# Patient Record
Sex: Male | Born: 1970
Health system: Southern US, Community
[De-identification: ages and names within clinical notes are randomized; demographics above are authoritative.]

## PROBLEM LIST (undated history)

## (undated) DIAGNOSIS — M109 Gout, unspecified: Secondary | ICD-10-CM

## (undated) DIAGNOSIS — Z9889 Other specified postprocedural states: Secondary | ICD-10-CM

## (undated) DIAGNOSIS — I10 Essential (primary) hypertension: Secondary | ICD-10-CM

## (undated) DIAGNOSIS — E119 Type 2 diabetes mellitus without complications: Secondary | ICD-10-CM

## (undated) DIAGNOSIS — E669 Obesity, unspecified: Secondary | ICD-10-CM

## (undated) DIAGNOSIS — G43909 Migraine, unspecified, not intractable, without status migrainosus: Secondary | ICD-10-CM

## (undated) DIAGNOSIS — E785 Hyperlipidemia, unspecified: Secondary | ICD-10-CM

## (undated) HISTORY — DX: Other specified postprocedural states: Z98.890

## (undated) HISTORY — DX: Obesity, unspecified: E66.9

## (undated) HISTORY — DX: Type 2 diabetes mellitus without complications: E11.9

## (undated) HISTORY — DX: Hyperlipidemia, unspecified: E78.5

## (undated) HISTORY — PX: HAND SURGERY: SHX662

## (undated) HISTORY — DX: Gout, unspecified: M10.9

## (undated) HISTORY — DX: Essential (primary) hypertension: I10

## (undated) HISTORY — DX: Migraine, unspecified, not intractable, without status migrainosus: G43.909

---

## 2009-01-04 ENCOUNTER — Ambulatory Visit (HOSPITAL_BASED_OUTPATIENT_CLINIC_OR_DEPARTMENT_OTHER): Admission: RE | Admit: 2009-01-04 | Discharge: 2009-01-04 | Payer: Self-pay | Admitting: Orthopedic Surgery

## 2009-01-04 ENCOUNTER — Encounter (INDEPENDENT_AMBULATORY_CARE_PROVIDER_SITE_OTHER): Payer: Self-pay | Admitting: Orthopedic Surgery

## 2009-08-16 ENCOUNTER — Ambulatory Visit (HOSPITAL_BASED_OUTPATIENT_CLINIC_OR_DEPARTMENT_OTHER): Admission: RE | Admit: 2009-08-16 | Discharge: 2009-08-16 | Payer: Self-pay | Admitting: Orthopedic Surgery

## 2009-10-06 ENCOUNTER — Ambulatory Visit (HOSPITAL_BASED_OUTPATIENT_CLINIC_OR_DEPARTMENT_OTHER): Admission: RE | Admit: 2009-10-06 | Discharge: 2009-10-06 | Payer: Self-pay | Admitting: Orthopedic Surgery

## 2009-12-05 ENCOUNTER — Ambulatory Visit (HOSPITAL_BASED_OUTPATIENT_CLINIC_OR_DEPARTMENT_OTHER): Admission: RE | Admit: 2009-12-05 | Discharge: 2009-12-05 | Payer: Self-pay | Admitting: Orthopedic Surgery

## 2011-02-12 NOTE — Op Note (Signed)
NAMEJAQUIL, Derek Hanna              ACCOUNT NO.:  [id number]   MEDICAL RECORD NO.:  [id number]          PATIENT TYPE:  AMB   LOCATION:  DSC                          FACILITY:  [facility]   PHYSICIAN:  Cindee Salt, M.D.       DATE OF BIRTH:  May 21, 1971   DATE OF PROCEDURE:  01/04/2009  DATE OF DISCHARGE:                               OPERATIVE REPORT   PREOPERATIVE DIAGNOSIS:  Mass, right index finger.   POSTOPERATIVE DIAGNOSIS:  Mass, right index finger.   OPERATION:  Excision, mass, right index finger.   SURGEON:  Cindee Salt, MD   ASSISTANT:  Joaquin Courts, RN   ANESTHESIA:  Forearm-based IV regional.   ANESTHESIOLOGIST:  Zenon Mayo, MD.   HISTORY:  The patient is a 40 year old male with a history of a mass on  the volar radial aspect of metacarpophalangeal joint of his right index  finger.  He is desirous of having this excised.  He is aware of risks  and complications including infection; recurrence of injury to arteries,  nerves, tendons, incomplete relief of symptoms; and dystrophy.  In the  preoperative area, the patient is seen.  The extremity marked by both  the patient and surgeon.  Antibiotic was given to the patient.   PROCEDURE:  The patient was brought to the operating room where a  forearm-based IV regional anesthetic was carried out without difficulty  under the direction of Dr. Sampson Goon.  He was prepped using ChloraPrep,  a 3 minute dry time was taken,  and he was then draped.  A time-out was  taken confirming the patient and procedure.  Following this, after  adequate anesthesia was afforded, a mid lateral incision was then made  and carried down through the subcutaneous tissue.  This was then brought  to the palmar side of the hand and carried down through subcutaneous  tissue proximally.  The digital artery and nerve were identified and  protected.  A multilobulated tan, brown solid tumor was immediately  encountered and with blunt and sharp dissection  this was dissected free,  appeared to arise from the flexor tendon sheath.  The entire specimen  was sent to Pathology.  The neurovascular structures were protected.  The area was locally infiltrated with 0.25% Marcaine without  epinephrine.  Bleeders were electrocauterized with bipolar.  The wound  was copiously irrigated with saline.  The skin was then closed with  interrupted 5-0 Vicryl Rapide sutures.  A sterile compressive dressing  was applied to the index middle finger.  The  patient tolerated the procedure well.  On deflation of the tourniquet,  all fingers immediately pinked.  He was taken to the recovery room for  observation in satisfactory condition.  He will be discharged home to  return to the [facility] [place] in 1 week, on Vicodin.            ______________________________  Cindee Salt, M.D.     GK/MEDQ  D:  01/04/2009  T:  01/05/2009  Job:  [id number]

## 2011-02-12 NOTE — Op Note (Signed)
NAMEDANUEL, FELICETTI              ACCOUNT NO.:  [id number]   MEDICAL RECORD NO.:  [id number]          PATIENT TYPE:  AMB   LOCATION:  DSC                          FACILITY:  [facility]   PHYSICIAN:  Cindee Salt, M.D.       DATE OF BIRTH:  1970/11/26   DATE OF PROCEDURE:  10/06/2008  DATE OF DISCHARGE:                               OPERATIVE REPORT   PREOPERATIVE DIAGNOSES:  1. Stenosing tenosynovitis, left index finger.  2. Carpal tunnel syndrome, left hand.   POSTOPERATIVE DIAGNOSES:  1. Stenosing tenosynovitis, left index finger.  2. Carpal tunnel syndrome, left hand.   OPERATION:  Release A1 pulley, left index finger with removal of flexor  sheath cyst and release of left carpal tunnel, left hand.   SURGEON:  Cindee Salt, MD   ANESTHESIA:  General.   ANESTHESIOLOGIST:  Janetta Hora. Gelene Mink, MD   HISTORY:  The patient is a 40 year old male with a history of bilateral  carpal tunnel syndrome.  EMG nerve conduction is positive.  Triggering  of his left index finger with moderate pain.  This has not responded to  conservative treatment.  He has elected to undergo surgical release of  the A1 pulley and release of the left carpal canal.  He is aware of  risks and complications including infection, recurrence of injury to  arteries, nerves, tendons, incomplete relief of symptoms, or dystrophy.  In the preoperative area, the patient is seen.  The extremity marked by  both the patient and surgeon.  Antibiotic given.   PROCEDURE:  The patient was brought to the operating room where a  general anesthetic was carried out without difficulty.  Under the  direction of Dr. Gelene Mink, he was prepped using ChloraPrep, supine  position, right arm free.  A 3-minute dry time was allowed.  Time-out  taken confirming the patient and procedure, he was then draped.  The  limb was exsanguinated with an Esmarch bandage.  Tourniquet placed high  on the forearm, was inflated to 250 mmHg.  An oblique  incision was made  over the A1 pulley of the left thumb, carried down through subcutaneous  tissue.  Bleeders were electrocauterized.  Neurovascular bundles were  identified and protected.  The radial side of the A1 pulley was then  released.  The A2 pulley was identified.  A small incision was made  centrally.  A central cyst was present over the distal aspect of the A1  pulley, this was removed.  The finger placed through a full range of  motion, a partial tenosynovectomy performed proximally.  No further  lesions or triggering was noted.  The wound was irrigated.  The skin  closed with interrupted 5-0 Vicryl Rapide sutures.  Separate incision  was then made over the volar aspect of the wrist in line with the ring  finger, carried down through the subcutaneous tissue.  The palmar fascia  was split, superficial palmar arch identified, the flexor tendon to the  ring and little finger identified to the ulnar side of median nerve and  carpal retinaculum was incised with sharp dissection.  A right-angle  and  Sewall retractor were placed between skin and forearm fascia.  The  fascia was released for approximately a centimeter and a half proximal  to the wrist crease under direct vision.  Canal was explored.  No  further lesions were identified.  Area of compression to the nerve was  apparent.  Persistent median artery was present.  Bleeders were  electrocauterized with bipolar.  The wound was copiously irrigated with  saline.  The skin then closed with interrupted 5-0 Vicryl Rapide  sutures.  A local infiltration was then given with 0.25% Marcaine  without epinephrine, approximately 6 mL was used.  A sterile compressive  dressing and splint with the fingers free was applied.  On deflation of  the tourniquet, all fingers immediately pinked.  He was taken to the  recovery room for observation in satisfactory condition.  He will be  discharged home.  To return to [facility] [place] in 1  week, on  Vicodin.           ______________________________  Cindee Salt, M.D.     GK/MEDQ  D:  10/06/2009  T:  10/07/2009  Job:  [id number]

## 2013-10-19 ENCOUNTER — Encounter: Payer: 59 | Attending: Family Medicine

## 2013-10-19 VITALS — Ht 67.0 in | Wt 221.0 lb

## 2013-10-19 DIAGNOSIS — E119 Type 2 diabetes mellitus without complications: Secondary | ICD-10-CM | POA: Insufficient documentation

## 2013-10-19 DIAGNOSIS — Z713 Dietary counseling and surveillance: Secondary | ICD-10-CM | POA: Insufficient documentation

## 2013-10-20 NOTE — Progress Notes (Signed)
Patient was seen on 10/19/13 for the first of a series of three diabetes self-management courses at the [facility].  Current HbA1c: 7.0%  The following learning objectives were met by the patient during this class:  Describe diabetes  State some common risk factors for diabetes  Defines the role of glucose and insulin  Identifies type of diabetes and pathophysiology  Describe the relationship between diabetes and cardiovascular risk  State the members of the Healthcare Team  States the rationale for glucose monitoring  State when to test glucose  State their individual Target Range  State the importance of logging glucose readings  Describe how to interpret glucose readings  Identifies A1C target  Explain the correlation between A1c and eAG values  State symptoms and treatment of high blood glucose  State symptoms and treatment of low blood glucose  Explain proper technique for glucose testing  Identifies proper sharps disposal  Handouts given during class include:  Living Well with Diabetes book  Carb Counting and Meal Planning book  Meal Plan Card  Carbohydrate guide  Meal planning worksheet  Low Sodium Flavoring Tips  The diabetes portion plate  R4V to eAG Conversion Chart  Diabetes Medications  Stress Management  Diabetes Recommended Care Schedule  Diabetes Success Plan  Core Class Satisfaction Survey  Follow-Up Plan:  Attend core 2

## 2013-10-26 DIAGNOSIS — E119 Type 2 diabetes mellitus without complications: Secondary | ICD-10-CM

## 2013-10-27 NOTE — Progress Notes (Signed)

## 2013-11-02 ENCOUNTER — Encounter: Payer: 59 | Attending: Family Medicine

## 2013-11-02 DIAGNOSIS — E119 Type 2 diabetes mellitus without complications: Secondary | ICD-10-CM | POA: Insufficient documentation

## 2013-11-02 DIAGNOSIS — Z713 Dietary counseling and surveillance: Secondary | ICD-10-CM | POA: Insufficient documentation

## 2013-11-02 NOTE — Progress Notes (Signed)
Patient was seen on 11/02/13 for the third of a series of three diabetes self-management courses at the [facility]. The following learning objectives were met by the patient during this class:    State the amount of activity recommended for healthy living   Describe activities suitable for individual needs   Identify ways to regularly incorporate activity into daily life   Identify barriers to activity and ways to over come these barriers  Identify diabetes medications being personally used and their primary action for lowering glucose and possible side effects   Describe role of stress on blood glucose and develop strategies to address psychosocial issues   Identify diabetes complications and ways to prevent them  Explain how to manage diabetes during illness   Evaluate success in meeting personal goal   Establish 2-3 goals that they will plan to diligently work on until they return for the  4-monthfollow-up visit  Goals:  Follow Diabetes Meal Plan as instructed  Aim for 15-30 mins of physical activity daily as tolerated  Bring food record and glucose log to your follow up visit  Your patient has established the following 4 month goals in their individualized success plan: I will count my carb choices at most meals and snacks and reduce fats by eating less sweets I will increase my activity level at least 3 days a week by being active 30 minutes or more 5 times a week  Your patient has identified these potential barriers to change:  Time management, work a little less  Your patient has identified their diabetes self-care support plan as  Attend support group  Educate spouse

## 2014-02-28 ENCOUNTER — Encounter: Payer: 59 | Attending: Family Medicine

## 2014-02-28 DIAGNOSIS — Z713 Dietary counseling and surveillance: Secondary | ICD-10-CM | POA: Insufficient documentation

## 2014-02-28 DIAGNOSIS — E119 Type 2 diabetes mellitus without complications: Secondary | ICD-10-CM | POA: Insufficient documentation

## 2014-03-01 NOTE — Progress Notes (Signed)
Patient was seen on 02/28/2014 for a review of the series of three diabetes self-management courses at the [facility]. The following learning objectives were met by the patient during this class:    Reviewed blood glucose monitoring and interpretation including the recommended target ranges and Hgb A1c.    Reviewed on carb counting, importance of regularly scheduled meals/snacks, and meal planning.    Reviewed the effects of physical activity on glucose levels and long-term glucose control.  Recommended goal of 150 minutes of physical activity/week.   Reviewed patient medications and discussed role of medication on blood glucose and possible side effects.   Discussed strategies to manage stress, psychosocial issues, and other obstacles to diabetes management.   Encouraged moderate weight reduction to improve glucose levels.     Reviewed short-term complications: hyper- and hypo-glycemia.  Discussed causes, symptoms, and treatment options.   Reviewed prevention, detection, and treatment of long-term complications.  Discussed the role of prolonged elevated glucose levels on body systems.  Goals:  Follow Diabetes Meal Plan as instructed  Eat 3 meals and 2 snacks, every 3-5 hrs  Limit carbohydrate intake to 45 grams carbohydrate/meal Limit carbohydrate intake to 15 grams carbohydrate/snack Add lean protein foods to meals/snacks  Monitor glucose levels as instructed by your doctor  Aim for goal of 15-30 mins of physical activity daily as tolerated  Bring food record and glucose log to your next nutrition visit   

## 2014-09-02 ENCOUNTER — Other Ambulatory Visit: Payer: Self-pay | Admitting: Gastroenterology

## 2016-02-16 ENCOUNTER — Other Ambulatory Visit: Payer: Self-pay | Admitting: Family Medicine

## 2016-02-16 ENCOUNTER — Ambulatory Visit
Admission: RE | Admit: 2016-02-16 | Discharge: 2016-02-16 | Disposition: A | Discharge status: Home / Self Care | Payer: 59 | Source: Ambulatory Visit | Attending: Family Medicine | Admitting: Family Medicine

## 2016-02-16 DIAGNOSIS — M79644 Pain in right finger(s): Secondary | ICD-10-CM

## 2016-05-23 ENCOUNTER — Other Ambulatory Visit: Payer: Self-pay | Admitting: Orthopedic Surgery

## 2016-11-07 ENCOUNTER — Ambulatory Visit (HOSPITAL_BASED_OUTPATIENT_CLINIC_OR_DEPARTMENT_OTHER): Admit: 2016-11-07 | Payer: 59 | Admitting: Orthopedic Surgery

## 2016-11-07 ENCOUNTER — Encounter (HOSPITAL_BASED_OUTPATIENT_CLINIC_OR_DEPARTMENT_OTHER): Payer: Self-pay

## 2016-11-07 SURGERY — RELEASE, A1 PULLEY, FOR TRIGGER FINGER
Anesthesia: Monitor Anesthesia Care | Laterality: Right

## 2016-11-08 DIAGNOSIS — M65342 Trigger finger, left ring finger: Secondary | ICD-10-CM | POA: Diagnosis not present

## 2016-11-08 DIAGNOSIS — M65332 Trigger finger, left middle finger: Secondary | ICD-10-CM | POA: Diagnosis not present

## 2016-11-08 DIAGNOSIS — M65311 Trigger thumb, right thumb: Secondary | ICD-10-CM | POA: Diagnosis not present

## 2016-12-02 DIAGNOSIS — E7800 Pure hypercholesterolemia, unspecified: Secondary | ICD-10-CM | POA: Diagnosis not present

## 2016-12-02 DIAGNOSIS — I10 Essential (primary) hypertension: Secondary | ICD-10-CM | POA: Diagnosis not present

## 2016-12-02 DIAGNOSIS — E119 Type 2 diabetes mellitus without complications: Secondary | ICD-10-CM | POA: Diagnosis not present

## 2017-03-05 DIAGNOSIS — E7800 Pure hypercholesterolemia, unspecified: Secondary | ICD-10-CM | POA: Diagnosis not present

## 2017-03-05 DIAGNOSIS — E119 Type 2 diabetes mellitus without complications: Secondary | ICD-10-CM | POA: Diagnosis not present

## 2017-05-05 DIAGNOSIS — M65342 Trigger finger, left ring finger: Secondary | ICD-10-CM | POA: Diagnosis not present

## 2017-05-05 DIAGNOSIS — M65332 Trigger finger, left middle finger: Secondary | ICD-10-CM | POA: Diagnosis not present

## 2017-05-27 DIAGNOSIS — E7800 Pure hypercholesterolemia, unspecified: Secondary | ICD-10-CM | POA: Diagnosis not present

## 2017-05-27 DIAGNOSIS — I10 Essential (primary) hypertension: Secondary | ICD-10-CM | POA: Diagnosis not present

## 2017-05-27 DIAGNOSIS — Z0000 Encounter for general adult medical examination without abnormal findings: Secondary | ICD-10-CM | POA: Diagnosis not present

## 2017-05-27 DIAGNOSIS — E1165 Type 2 diabetes mellitus with hyperglycemia: Secondary | ICD-10-CM | POA: Diagnosis not present

## 2017-07-21 ENCOUNTER — Other Ambulatory Visit: Payer: Self-pay | Admitting: Orthopedic Surgery

## 2017-09-17 ENCOUNTER — Encounter (HOSPITAL_BASED_OUTPATIENT_CLINIC_OR_DEPARTMENT_OTHER): Payer: Self-pay | Admitting: *Deleted

## 2017-09-24 ENCOUNTER — Encounter (HOSPITAL_BASED_OUTPATIENT_CLINIC_OR_DEPARTMENT_OTHER)
Admission: RE | Admit: 2017-09-24 | Discharge: 2017-09-24 | Disposition: A | Discharge status: Home / Self Care | Payer: 59 | Source: Ambulatory Visit | Attending: Orthopedic Surgery | Admitting: Orthopedic Surgery

## 2017-09-24 ENCOUNTER — Other Ambulatory Visit: Payer: Self-pay

## 2017-09-24 DIAGNOSIS — M65342 Trigger finger, left ring finger: Secondary | ICD-10-CM | POA: Diagnosis not present

## 2017-09-24 DIAGNOSIS — E119 Type 2 diabetes mellitus without complications: Secondary | ICD-10-CM | POA: Diagnosis not present

## 2017-09-24 DIAGNOSIS — Z7982 Long term (current) use of aspirin: Secondary | ICD-10-CM | POA: Diagnosis not present

## 2017-09-24 DIAGNOSIS — I10 Essential (primary) hypertension: Secondary | ICD-10-CM | POA: Diagnosis not present

## 2017-09-24 DIAGNOSIS — Z79899 Other long term (current) drug therapy: Secondary | ICD-10-CM | POA: Diagnosis not present

## 2017-09-24 DIAGNOSIS — E785 Hyperlipidemia, unspecified: Secondary | ICD-10-CM | POA: Diagnosis not present

## 2017-09-24 DIAGNOSIS — M65332 Trigger finger, left middle finger: Secondary | ICD-10-CM | POA: Diagnosis not present

## 2017-09-24 LAB — BASIC METABOLIC PANEL
ANION GAP: 10 (ref 5–15)
BUN: 10 mg/dL (ref 6–20)
CO2: 26 mmol/L (ref 22–32)
Calcium: 8.6 mg/dL — ABNORMAL LOW (ref 8.9–10.3)
Chloride: 103 mmol/L (ref 101–111)
Creatinine, Ser: 1.04 mg/dL (ref 0.61–1.24)
Glucose, Bld: 106 mg/dL — ABNORMAL HIGH (ref 65–99)
POTASSIUM: 4 mmol/L (ref 3.5–5.1)
SODIUM: 139 mmol/L (ref 135–145)

## 2017-09-25 ENCOUNTER — Encounter (HOSPITAL_BASED_OUTPATIENT_CLINIC_OR_DEPARTMENT_OTHER): Payer: Self-pay | Admitting: *Deleted

## 2017-09-25 ENCOUNTER — Ambulatory Visit (HOSPITAL_BASED_OUTPATIENT_CLINIC_OR_DEPARTMENT_OTHER): Payer: 59 | Admitting: Anesthesiology

## 2017-09-25 ENCOUNTER — Ambulatory Visit (HOSPITAL_BASED_OUTPATIENT_CLINIC_OR_DEPARTMENT_OTHER)
Admission: RE | Admit: 2017-09-25 | Discharge: 2017-09-25 | Disposition: A | Discharge status: Home / Self Care | Payer: 59 | Source: Ambulatory Visit | Attending: Orthopedic Surgery | Admitting: Orthopedic Surgery

## 2017-09-25 ENCOUNTER — Other Ambulatory Visit: Payer: Self-pay

## 2017-09-25 ENCOUNTER — Encounter (HOSPITAL_BASED_OUTPATIENT_CLINIC_OR_DEPARTMENT_OTHER)
Admission: RE | Disposition: A | Discharge status: Home / Self Care | Payer: Self-pay | Source: Ambulatory Visit | Attending: Orthopedic Surgery

## 2017-09-25 DIAGNOSIS — Z79899 Other long term (current) drug therapy: Secondary | ICD-10-CM | POA: Insufficient documentation

## 2017-09-25 DIAGNOSIS — M65332 Trigger finger, left middle finger: Secondary | ICD-10-CM | POA: Diagnosis not present

## 2017-09-25 DIAGNOSIS — E119 Type 2 diabetes mellitus without complications: Secondary | ICD-10-CM | POA: Diagnosis not present

## 2017-09-25 DIAGNOSIS — I10 Essential (primary) hypertension: Secondary | ICD-10-CM | POA: Insufficient documentation

## 2017-09-25 DIAGNOSIS — M65342 Trigger finger, left ring finger: Secondary | ICD-10-CM | POA: Insufficient documentation

## 2017-09-25 DIAGNOSIS — E785 Hyperlipidemia, unspecified: Secondary | ICD-10-CM | POA: Insufficient documentation

## 2017-09-25 DIAGNOSIS — Z7982 Long term (current) use of aspirin: Secondary | ICD-10-CM | POA: Insufficient documentation

## 2017-09-25 DIAGNOSIS — M65842 Other synovitis and tenosynovitis, left hand: Secondary | ICD-10-CM | POA: Diagnosis not present

## 2017-09-25 HISTORY — PX: TRIGGER FINGER RELEASE: SHX641

## 2017-09-25 SURGERY — RELEASE, A1 PULLEY, FOR TRIGGER FINGER
Anesthesia: Monitor Anesthesia Care | Site: Finger | Laterality: Left

## 2017-09-25 MED ORDER — OXYCODONE HCL 5 MG/5ML PO SOLN
5.0000 mg | Freq: Once | ORAL | Status: DC | PRN
Start: 2017-09-25 — End: 2017-09-25

## 2017-09-25 MED ORDER — LIDOCAINE HCL (PF) 0.5 % IJ SOLN
INTRAMUSCULAR | Status: DC | PRN
  Administered 2017-09-25: 35 mL via INTRAVENOUS

## 2017-09-25 MED ORDER — FENTANYL CITRATE (PF) 100 MCG/2ML IJ SOLN
INTRAMUSCULAR | Status: AC
  Filled 2017-09-25: qty 2

## 2017-09-25 MED ORDER — PROPOFOL 500 MG/50ML IV EMUL
INTRAVENOUS | Status: AC
  Filled 2017-09-25: qty 50

## 2017-09-25 MED ORDER — DEXAMETHASONE SODIUM PHOSPHATE 10 MG/ML IJ SOLN
INTRAMUSCULAR | Status: DC | PRN
  Administered 2017-09-25: 4 mg via INTRAVENOUS

## 2017-09-25 MED ORDER — LACTATED RINGERS IV SOLN
INTRAVENOUS | Status: DC
  Administered 2017-09-25: 08:00:00 via INTRAVENOUS

## 2017-09-25 MED ORDER — BUPIVACAINE HCL (PF) 0.25 % IJ SOLN
INTRAMUSCULAR | Status: DC | PRN
  Administered 2017-09-25: 9 mL

## 2017-09-25 MED ORDER — MIDAZOLAM HCL 2 MG/2ML IJ SOLN
INTRAMUSCULAR | Status: AC
  Filled 2017-09-25: qty 2

## 2017-09-25 MED ORDER — CEFAZOLIN SODIUM-DEXTROSE 2-4 GM/100ML-% IV SOLN
INTRAVENOUS | Status: AC
  Filled 2017-09-25: qty 100

## 2017-09-25 MED ORDER — FENTANYL CITRATE (PF) 100 MCG/2ML IJ SOLN
50.0000 ug | INTRAMUSCULAR | Status: DC | PRN
  Administered 2017-09-25 (×2): 50 ug via INTRAVENOUS

## 2017-09-25 MED ORDER — TRAMADOL HCL 50 MG PO TABS: 50.0000 mg | ORAL_TABLET | Freq: Four times a day (QID) | ORAL | 0 refills | Status: DC | PRN

## 2017-09-25 MED ORDER — CEFAZOLIN SODIUM-DEXTROSE 2-4 GM/100ML-% IV SOLN
2.0000 g | INTRAVENOUS | Status: AC
  Administered 2017-09-25: 2 g via INTRAVENOUS

## 2017-09-25 MED ORDER — SCOPOLAMINE 1 MG/3DAYS TD PT72: 1.0000 | MEDICATED_PATCH | Freq: Once | TRANSDERMAL | Status: DC | PRN

## 2017-09-25 MED ORDER — CHLORHEXIDINE GLUCONATE 4 % EX LIQD: 60.0000 mL | Freq: Once | CUTANEOUS | Status: DC

## 2017-09-25 MED ORDER — PROPOFOL 10 MG/ML IV BOLUS
INTRAVENOUS | Status: DC | PRN
  Administered 2017-09-25 (×2): 20 mg via INTRAVENOUS

## 2017-09-25 MED ORDER — ONDANSETRON HCL 4 MG/2ML IJ SOLN
INTRAMUSCULAR | Status: AC
  Filled 2017-09-25: qty 2

## 2017-09-25 MED ORDER — LIDOCAINE HCL (CARDIAC) 20 MG/ML IV SOLN
INTRAVENOUS | Status: DC | PRN
  Administered 2017-09-25: 20 mg via INTRAVENOUS

## 2017-09-25 MED ORDER — ONDANSETRON HCL 4 MG/2ML IJ SOLN: 4.0000 mg | Freq: Four times a day (QID) | INTRAMUSCULAR | Status: DC | PRN

## 2017-09-25 MED ORDER — BUPIVACAINE HCL (PF) 0.25 % IJ SOLN
INTRAMUSCULAR | Status: AC
  Filled 2017-09-25: qty 120

## 2017-09-25 MED ORDER — FENTANYL CITRATE (PF) 100 MCG/2ML IJ SOLN: 25.0000 ug | INTRAMUSCULAR | Status: DC | PRN

## 2017-09-25 MED ORDER — MIDAZOLAM HCL 2 MG/2ML IJ SOLN
1.0000 mg | INTRAMUSCULAR | Status: DC | PRN
Start: 2017-09-25 — End: 2017-09-25
  Administered 2017-09-25: 2 mg via INTRAVENOUS

## 2017-09-25 MED ORDER — ONDANSETRON HCL 4 MG/2ML IJ SOLN
INTRAMUSCULAR | Status: DC | PRN
  Administered 2017-09-25: 4 mg via INTRAVENOUS

## 2017-09-25 MED ORDER — DEXAMETHASONE SODIUM PHOSPHATE 10 MG/ML IJ SOLN
INTRAMUSCULAR | Status: AC
  Filled 2017-09-25: qty 1

## 2017-09-25 MED ORDER — OXYCODONE HCL 5 MG PO TABS: 5.0000 mg | ORAL_TABLET | Freq: Once | ORAL | Status: DC | PRN

## 2017-09-25 MED ORDER — LIDOCAINE 2% (20 MG/ML) 5 ML SYRINGE
INTRAMUSCULAR | Status: AC
  Filled 2017-09-25: qty 5

## 2017-09-25 SURGICAL SUPPLY — 32 items
BANDAGE COBAN STERILE 2 (GAUZE/BANDAGES/DRESSINGS) ×3 IMPLANT
BLADE SURG 15 STRL LF DISP TIS (BLADE) ×1 IMPLANT
BLADE SURG 15 STRL SS (BLADE) ×2
BNDG ESMARK 4X9 LF (GAUZE/BANDAGES/DRESSINGS) ×3 IMPLANT
CHLORAPREP W/TINT 26ML (MISCELLANEOUS) ×3 IMPLANT
CORD BIPOLAR FORCEPS 12FT (ELECTRODE) IMPLANT
COVER BACK TABLE 60X90IN (DRAPES) ×3 IMPLANT
COVER MAYO STAND STRL (DRAPES) ×3 IMPLANT
CUFF TOURNIQUET SINGLE 18IN (TOURNIQUET CUFF) ×3 IMPLANT
DECANTER SPIKE VIAL GLASS SM (MISCELLANEOUS) IMPLANT
DRAPE EXTREMITY T 121X128X90 (DRAPE) ×3 IMPLANT
DRAPE SURG 17X23 STRL (DRAPES) ×3 IMPLANT
GAUZE SPONGE 4X4 12PLY STRL (GAUZE/BANDAGES/DRESSINGS) ×3 IMPLANT
GAUZE XEROFORM 1X8 LF (GAUZE/BANDAGES/DRESSINGS) ×3 IMPLANT
GLOVE BIOGEL PI IND STRL 7.0 (GLOVE) ×2 IMPLANT
GLOVE BIOGEL PI IND STRL 8.5 (GLOVE) ×1 IMPLANT
GLOVE BIOGEL PI INDICATOR 7.0 (GLOVE) ×4
GLOVE BIOGEL PI INDICATOR 8.5 (GLOVE) ×2
GLOVE ECLIPSE 6.5 STRL STRAW (GLOVE) ×3 IMPLANT
GLOVE SURG ORTHO 8.0 STRL STRW (GLOVE) ×3 IMPLANT
GOWN STRL REUS W/ TWL LRG LVL3 (GOWN DISPOSABLE) ×1 IMPLANT
GOWN STRL REUS W/TWL LRG LVL3 (GOWN DISPOSABLE) ×2
GOWN STRL REUS W/TWL XL LVL3 (GOWN DISPOSABLE) ×3 IMPLANT
NEEDLE PRECISIONGLIDE 27X1.5 (NEEDLE) ×3 IMPLANT
NS IRRIG 1000ML POUR BTL (IV SOLUTION) ×3 IMPLANT
PACK BASIN DAY SURGERY FS (CUSTOM PROCEDURE TRAY) ×3 IMPLANT
STOCKINETTE 4X48 STRL (DRAPES) ×3 IMPLANT
SUT ETHILON 4 0 PS 2 18 (SUTURE) ×3 IMPLANT
SYR BULB 3OZ (MISCELLANEOUS) ×3 IMPLANT
SYR CONTROL 10ML LL (SYRINGE) ×3 IMPLANT
TOWEL OR 17X24 6PK STRL BLUE (TOWEL DISPOSABLE) ×6 IMPLANT
UNDERPAD 30X30 (UNDERPADS AND DIAPERS) ×3 IMPLANT

## 2017-09-25 NOTE — Transfer of Care (Signed)
Immediate Anesthesia Transfer of Care Note  Patient: Derek Hanna  Procedure(s) Performed: RELEASE TRIGGER FINGER/A-1 PULLEY LEFT MIDDLE FINGER AND LEFT RING FINGER (Left Finger)  Patient Location: PACU  Anesthesia Type:MAC and Bier block  Level of Consciousness: awake, alert  and oriented  Airway & Oxygen Therapy: Patient Spontanous Breathing  Post-op Assessment: Report given to RN and Post -op Vital signs reviewed and stable  Post vital signs: Reviewed and stable  Last Vitals:  Vitals:   09/25/17 0751  BP: 137/63  Pulse: 65  Resp: 20  Temp: 36.8 C  SpO2: 99%    Last Pain:  Vitals:   09/25/17 0751  TempSrc: Oral         Complications: No apparent anesthesia complications

## 2017-09-25 NOTE — Anesthesia Preprocedure Evaluation (Signed)
Anesthesia Evaluation  Patient identified by MRN, date of birth, ID band Patient awake    Reviewed: Allergy & Precautions, H&P , NPO status , Patient's Chart, lab work & pertinent test results  Airway Mallampati: II   Neck ROM: full    Dental   Pulmonary neg pulmonary ROS,    breath sounds clear to auscultation       Cardiovascular hypertension,  Rhythm:regular Rate:Normal     Neuro/Psych  Headaches,    GI/Hepatic   Endo/Other  diabetes, Type 2  Renal/GU      Musculoskeletal   Abdominal   Peds  Hematology   Anesthesia Other Findings   Reproductive/Obstetrics                             Anesthesia Physical Anesthesia Plan  ASA: II  Anesthesia Plan: Bier Block and MAC and Bier Block-LIDOCAINE ONLY   Post-op Pain Management:    Induction: Intravenous  PONV Risk Score and Plan: 1 and Ondansetron, Propofol infusion, Midazolam and Treatment may vary due to age or medical condition  Airway Management Planned: Simple Face Mask  Additional Equipment:   Intra-op Plan:   Post-operative Plan:   Informed Consent: I have reviewed the patients History and Physical, chart, labs and discussed the procedure including the risks, benefits and alternatives for the proposed anesthesia with the patient or authorized representative who has indicated his/her understanding and acceptance.     Plan Discussed with: CRNA, Anesthesiologist and Surgeon  Anesthesia Plan Comments:         Anesthesia Quick Evaluation

## 2017-09-25 NOTE — Op Note (Signed)
Dictation Number [id number]

## 2017-09-25 NOTE — Op Note (Signed)
NAMDorena Bodo:  Roemmich, Jusitn              ACCOUNT NO.:  [id number]  MEDICAL RECORD NO.:  [id number]  LOCATION:                                 FACILITY:  PHYSICIAN:  Cindee SaltGary Estreya Clay, M.D.            DATE OF BIRTH:  DATE OF PROCEDURE:  09/15/2017 DATE OF DISCHARGE:                              OPERATIVE REPORT   PREOPERATIVE DIAGNOSIS:  Stenosing tenosynovitis, left middle and left ring fingers.  POSTOPERATIVE DIAGNOSIS:  Stenosing tenosynovitis, left middle and left ring fingers.  OPERATION:  Release A1 pulley, left middle and left ring fingers.  SURGEON:  Cindee SaltGary Kieli Golladay, M.D.  ASSISTANT:  None.  ANESTHESIA:  Forearm based IV regional with local infiltration.  [facility]  ANESTHESIOLOGIST:  Achille RichAdam Hodierne, MD.  HISTORY:  The patient is a 46 year old male with a history of triggering of his middle and ring fingers, left hand.  This is not responded to conservative treatment.  He has elected to undergo surgical release of the A1 pulley of each of those digits.  Pre, peri, and postoperative course have been discussed along with risks and complications.  He is aware that there is no guarantee to the surgery; the possibility of infection; recurrence of injury to arteries, nerves, tendons; incomplete relief of symptoms; and dystrophy.  In preoperative area, the patient was seen, the extremity marked by both patient and surgeon.  Antibiotic given.  DESCRIPTION OF PROCEDURE:  The patient was brought to the operating room, where a forearm-based IV regional anesthetic was carried out without difficulty.  He was prepped using ChloraPrep in supine position with left arm free.  A 3-minute dry time was allowed and time-out taken, confirming patient and procedure.  An oblique incision was made over the A1 pulley of the left middle finger carried down through subcutaneous tissue.  The dissection was carried down protecting neurovascular bundles radially and ulnarly.   Retractors placed.  The A1 pulley was then released on its radial aspect.  A small incision was made centrally in A2.  Deep tendon separated with partial tenosynovectomy proximally to allow full flexion of the finger passively without any further triggering noted.  An oblique incision was then made over the ring finger A1 pulley, carried down through subcutaneous tissue.  Retractors again placed protecting neurovascular bundles radially and ulnarly.  The A1 pulley was identified and released on its radial aspect.  A small incision was made centrally in A2.  Tendons separated with a partial tenosynovectomy proximally.  The finger placed through a full passive range of motion, no further triggering was identified.  The wound was copiously irrigated with saline.  This patient could still flex his fingers, though fully flexed and no triggering was noted actively.  The wounds were then closed with interrupted 4-0 nylon sutures local infiltration with 0.25% bupivacaine without epinephrine was given, approximately 8 mL was used.  A sterile compressive dressing with the fingers free was applied.  On deflation of the tourniquet, all fingers immediately pinked.  He was taken to the recovery room for observation in satisfactory condition.  He will be discharged home [facility] [place] [facility], on Ultram.          ______________________________ Cindee SaltGary Brayan Votaw, M.D.     GK/MEDQ  D:  09/25/2017  T:  09/25/2017  Job:  [id number]

## 2017-09-25 NOTE — Anesthesia Procedure Notes (Signed)
Procedure Name: MAC Performed by: Delvon Chipps W, CRNA Pre-anesthesia Checklist: Patient identified, Timeout performed, Emergency Drugs available, Patient being monitored and Suction available Oxygen Delivery Method: Simple face mask       

## 2017-09-25 NOTE — Anesthesia Procedure Notes (Signed)
Anesthesia Regional Block: Bier block (IV Regional)   Pre-Anesthetic Checklist: ,, timeout performed, Correct Patient, Correct Site, Correct Laterality, Correct Procedure,, site marked, surgical consent,, at surgeon's request Needles:  Injection technique: Single-shot  Needle Type: Other      Needle Gauge: 20     Additional Needles:   Procedures:,,,,, intact distal pulses, Esmarch exsanguination, single tourniquet utilized,  Narrative:   Performed by: Personally       
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
## 2017-09-25 NOTE — Brief Op Note (Signed)
09/25/2017  9:14 AM  PATIENT:  Dorena BodoMarcus Simi  46 y.o. male  PRE-OPERATIVE DIAGNOSIS:  stenosing Tenosynovitis left middle and ring finger  POST-OPERATIVE DIAGNOSIS:  stenosing Tenosynovitis left middle and ring finger  PROCEDURE:  Procedure(s): RELEASE TRIGGER FINGER/A-1 PULLEY LEFT MIDDLE FINGER AND LEFT RING FINGER (Left)  SURGEON:  Surgeon(s) and Role:    Cindee Salt* Compton Brigance, MD - Primary  PHYSICIAN ASSISTANT:   ASSISTANTS: none   ANESTHESIA:   local, regional and IV sedation  EBL:  2 mL   BLOOD ADMINISTERED:none  DRAINS: none   LOCAL MEDICATIONS USED:  BUPIVICAINE   SPECIMEN:  No Specimen  DISPOSITION OF SPECIMEN:  N/A  COUNTS:  YES  TOURNIQUET:   Total Tourniquet Time Documented: Forearm (Left) - 19 minutes Total: Forearm (Left) - 19 minutes   DICTATION: .Other Dictation: Dictation Number [id number]  PLAN OF CARE: Discharge to home after PACU  PATIENT DISPOSITION:  PACU - hemodynamically stable.

## 2017-09-25 NOTE — Discharge Instructions (Addendum)

## 2017-09-25 NOTE — Anesthesia Postprocedure Evaluation (Signed)
Anesthesia Post Note  Patient: Derek Hanna  Procedure(s) Performed: RELEASE TRIGGER FINGER/A-1 PULLEY LEFT MIDDLE FINGER AND LEFT RING FINGER (Left Finger)     Patient location during evaluation: PACU Anesthesia Type: MAC and Bier Block Level of consciousness: awake and alert Pain management: pain level controlled Vital Signs Assessment: post-procedure vital signs reviewed and stable Respiratory status: spontaneous breathing, nonlabored ventilation, respiratory function stable and patient connected to nasal cannula oxygen Cardiovascular status: stable and blood pressure returned to baseline Postop Assessment: no apparent nausea or vomiting Anesthetic complications: no    Last Vitals:  Vitals:   09/25/17 0930 09/25/17 0943  BP: 127/77 136/78  Pulse: 64 68  Resp: 11 17  Temp:  36.6 C  SpO2: 98% 99%    Last Pain:  Vitals:   09/25/17 0943  TempSrc: Oral  PainSc: 0-No pain                 Yemaya Barnier S

## 2017-09-26 ENCOUNTER — Encounter (HOSPITAL_BASED_OUTPATIENT_CLINIC_OR_DEPARTMENT_OTHER): Payer: Self-pay | Admitting: Orthopedic Surgery

## 2017-11-28 DIAGNOSIS — E119 Type 2 diabetes mellitus without complications: Secondary | ICD-10-CM | POA: Diagnosis not present

## 2017-11-28 DIAGNOSIS — E7800 Pure hypercholesterolemia, unspecified: Secondary | ICD-10-CM | POA: Diagnosis not present

## 2017-11-28 DIAGNOSIS — I10 Essential (primary) hypertension: Secondary | ICD-10-CM | POA: Diagnosis not present

## 2018-03-06 IMAGING — CR DG FINGER THUMB 2+V*R*
1 series · 1 of 1 positions shown · non-contrast
Comparison: None.

CLINICAL DATA: Injury 2 weeks ago

EXAM:
RIGHT THUMB 2+V

[view not recorded]
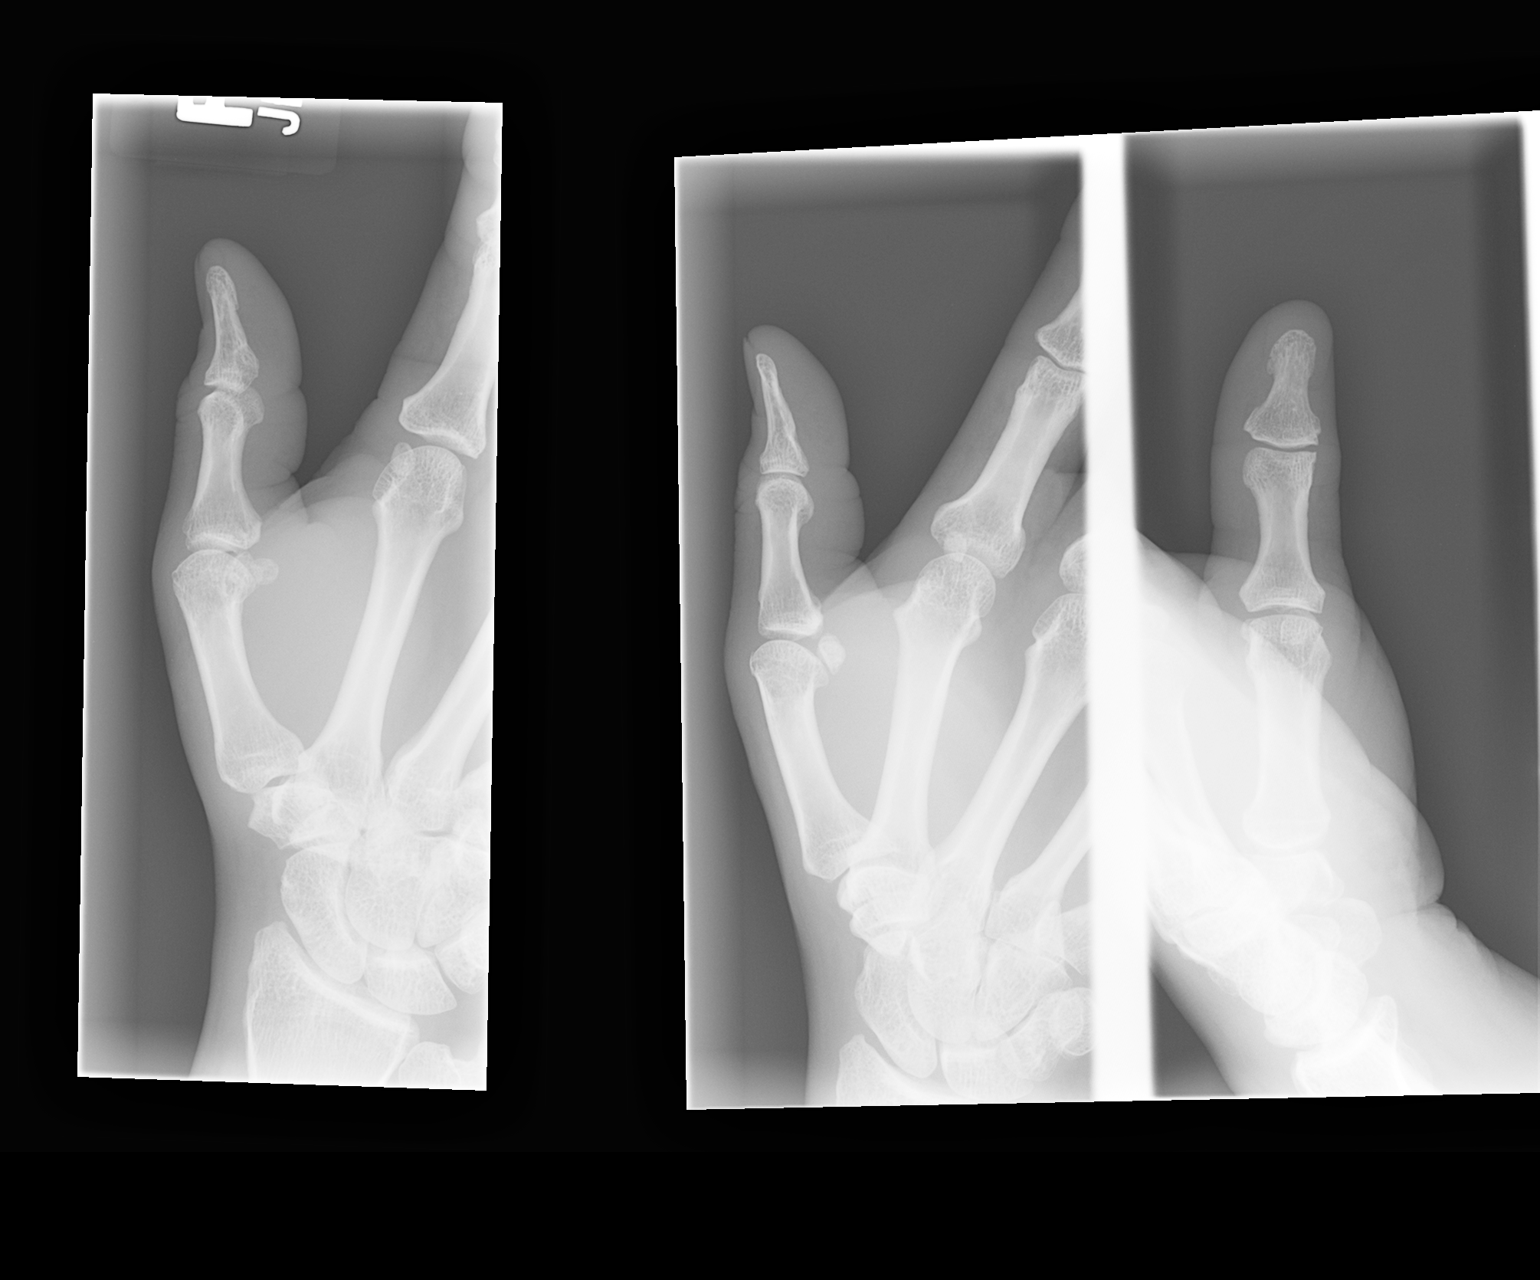

[1 of 1 positions shown; findings below may reference images not displayed]

FINDINGS: No acute fracture. No dislocation.  Unremarkable soft tissues.
IMPRESSION: No acute bony pathology

## 2018-05-28 DIAGNOSIS — E119 Type 2 diabetes mellitus without complications: Secondary | ICD-10-CM | POA: Diagnosis not present

## 2018-05-28 DIAGNOSIS — E7800 Pure hypercholesterolemia, unspecified: Secondary | ICD-10-CM | POA: Diagnosis not present

## 2018-05-28 DIAGNOSIS — Z0000 Encounter for general adult medical examination without abnormal findings: Secondary | ICD-10-CM | POA: Diagnosis not present

## 2018-05-28 DIAGNOSIS — I10 Essential (primary) hypertension: Secondary | ICD-10-CM | POA: Diagnosis not present

## 2018-05-28 DIAGNOSIS — K625 Hemorrhage of anus and rectum: Secondary | ICD-10-CM | POA: Diagnosis not present

## 2018-06-10 DIAGNOSIS — K642 Third degree hemorrhoids: Secondary | ICD-10-CM | POA: Diagnosis not present

## 2018-08-26 DIAGNOSIS — M65331 Trigger finger, right middle finger: Secondary | ICD-10-CM | POA: Diagnosis not present

## 2018-09-01 ENCOUNTER — Other Ambulatory Visit: Payer: Self-pay | Admitting: Orthopedic Surgery

## 2018-09-15 ENCOUNTER — Other Ambulatory Visit: Payer: Self-pay

## 2018-09-15 ENCOUNTER — Encounter (HOSPITAL_BASED_OUTPATIENT_CLINIC_OR_DEPARTMENT_OTHER): Payer: Self-pay | Admitting: *Deleted

## 2018-09-18 ENCOUNTER — Encounter (HOSPITAL_BASED_OUTPATIENT_CLINIC_OR_DEPARTMENT_OTHER)
Admission: RE | Admit: 2018-09-18 | Discharge: 2018-09-18 | Disposition: A | Discharge status: Home / Self Care | Payer: 59 | Source: Ambulatory Visit | Attending: Orthopedic Surgery | Admitting: Orthopedic Surgery

## 2018-09-18 ENCOUNTER — Other Ambulatory Visit: Payer: Self-pay

## 2018-09-18 DIAGNOSIS — Z01818 Encounter for other preprocedural examination: Secondary | ICD-10-CM | POA: Insufficient documentation

## 2018-09-18 DIAGNOSIS — I10 Essential (primary) hypertension: Secondary | ICD-10-CM | POA: Insufficient documentation

## 2018-09-18 LAB — BASIC METABOLIC PANEL
Anion gap: 11 (ref 5–15)
BUN: 12 mg/dL (ref 6–20)
CO2: 25 mmol/L (ref 22–32)
Calcium: 9.1 mg/dL (ref 8.9–10.3)
Chloride: 104 mmol/L (ref 98–111)
Creatinine, Ser: 1 mg/dL (ref 0.61–1.24)
GFR calc Af Amer: >60 mL/min (ref >60)
GFR calc non Af Amer: >60 mL/min (ref >60)
GLUCOSE: 87 mg/dL (ref 70–99)
Potassium: 4 mmol/L (ref 3.5–5.1)
Sodium: 140 mmol/L (ref 135–145)

## 2018-09-25 ENCOUNTER — Ambulatory Visit (HOSPITAL_BASED_OUTPATIENT_CLINIC_OR_DEPARTMENT_OTHER)
Admission: RE | Admit: 2018-09-25 | Discharge: 2018-09-25 | Disposition: A | Discharge status: Home / Self Care | Payer: 59 | Source: Ambulatory Visit | Attending: Orthopedic Surgery | Admitting: Orthopedic Surgery

## 2018-09-25 ENCOUNTER — Other Ambulatory Visit: Payer: Self-pay

## 2018-09-25 ENCOUNTER — Encounter (HOSPITAL_BASED_OUTPATIENT_CLINIC_OR_DEPARTMENT_OTHER): Payer: Self-pay | Admitting: Certified Registered"

## 2018-09-25 ENCOUNTER — Ambulatory Visit (HOSPITAL_BASED_OUTPATIENT_CLINIC_OR_DEPARTMENT_OTHER): Payer: 59 | Admitting: Certified Registered"

## 2018-09-25 ENCOUNTER — Encounter (HOSPITAL_BASED_OUTPATIENT_CLINIC_OR_DEPARTMENT_OTHER)
Admission: RE | Disposition: A | Discharge status: Home / Self Care | Payer: Self-pay | Source: Ambulatory Visit | Attending: Orthopedic Surgery

## 2018-09-25 DIAGNOSIS — E119 Type 2 diabetes mellitus without complications: Secondary | ICD-10-CM | POA: Diagnosis not present

## 2018-09-25 DIAGNOSIS — M65331 Trigger finger, right middle finger: Secondary | ICD-10-CM | POA: Insufficient documentation

## 2018-09-25 DIAGNOSIS — M65841 Other synovitis and tenosynovitis, right hand: Secondary | ICD-10-CM | POA: Diagnosis present

## 2018-09-25 DIAGNOSIS — I10 Essential (primary) hypertension: Secondary | ICD-10-CM | POA: Insufficient documentation

## 2018-09-25 DIAGNOSIS — M659 Synovitis and tenosynovitis, unspecified: Secondary | ICD-10-CM | POA: Diagnosis not present

## 2018-09-25 HISTORY — PX: TRIGGER FINGER RELEASE: SHX641

## 2018-09-25 SURGERY — RELEASE, A1 PULLEY, FOR TRIGGER FINGER
Anesthesia: Regional | Site: Finger | Laterality: Right

## 2018-09-25 MED ORDER — KETOROLAC TROMETHAMINE 30 MG/ML IJ SOLN
INTRAMUSCULAR | Status: DC | PRN
  Administered 2018-09-25: 30 mg via INTRAVENOUS

## 2018-09-25 MED ORDER — PROPOFOL 10 MG/ML IV BOLUS
INTRAVENOUS | Status: DC | PRN
  Administered 2018-09-25: 20 mg via INTRAVENOUS

## 2018-09-25 MED ORDER — FENTANYL CITRATE (PF) 100 MCG/2ML IJ SOLN
INTRAMUSCULAR | Status: AC
  Filled 2018-09-25: qty 2

## 2018-09-25 MED ORDER — MIDAZOLAM HCL 2 MG/2ML IJ SOLN
1.0000 mg | INTRAMUSCULAR | Status: DC | PRN
  Administered 2018-09-25: 2 mg via INTRAVENOUS

## 2018-09-25 MED ORDER — 0.9 % SODIUM CHLORIDE (POUR BTL) OPTIME
TOPICAL | Status: DC | PRN
  Administered 2018-09-25: 200 mL

## 2018-09-25 MED ORDER — KETOROLAC TROMETHAMINE 30 MG/ML IJ SOLN
INTRAMUSCULAR | Status: AC
  Filled 2018-09-25: qty 1

## 2018-09-25 MED ORDER — LIDOCAINE 2% (20 MG/ML) 5 ML SYRINGE
INTRAMUSCULAR | Status: AC
  Filled 2018-09-25: qty 5

## 2018-09-25 MED ORDER — LIDOCAINE HCL (CARDIAC) PF 100 MG/5ML IV SOSY
PREFILLED_SYRINGE | INTRAVENOUS | Status: DC | PRN
  Administered 2018-09-25: 20 mg via INTRAVENOUS

## 2018-09-25 MED ORDER — ONDANSETRON HCL 4 MG/2ML IJ SOLN
INTRAMUSCULAR | Status: AC
  Filled 2018-09-25: qty 4

## 2018-09-25 MED ORDER — MIDAZOLAM HCL 2 MG/2ML IJ SOLN
INTRAMUSCULAR | Status: AC
  Filled 2018-09-25: qty 2

## 2018-09-25 MED ORDER — LACTATED RINGERS IV SOLN
INTRAVENOUS | Status: DC
  Administered 2018-09-25: 13:00:00 via INTRAVENOUS

## 2018-09-25 MED ORDER — TRAMADOL HCL 50 MG PO TABS: 50.0000 mg | ORAL_TABLET | Freq: Four times a day (QID) | ORAL | 0 refills | Status: DC | PRN

## 2018-09-25 MED ORDER — FENTANYL CITRATE (PF) 100 MCG/2ML IJ SOLN
50.0000 ug | INTRAMUSCULAR | Status: DC | PRN
  Administered 2018-09-25: 100 ug via INTRAVENOUS

## 2018-09-25 MED ORDER — LIDOCAINE HCL (PF) 0.5 % IJ SOLN
INTRAMUSCULAR | Status: DC | PRN
  Administered 2018-09-25: 35 mL via INTRAVENOUS

## 2018-09-25 MED ORDER — CEFAZOLIN SODIUM-DEXTROSE 2-4 GM/100ML-% IV SOLN
2.0000 g | INTRAVENOUS | Status: AC
  Administered 2018-09-25: 2 g via INTRAVENOUS

## 2018-09-25 MED ORDER — SCOPOLAMINE 1 MG/3DAYS TD PT72: 1.0000 | MEDICATED_PATCH | Freq: Once | TRANSDERMAL | Status: DC | PRN

## 2018-09-25 MED ORDER — CHLORHEXIDINE GLUCONATE 4 % EX LIQD: 60.0000 mL | Freq: Once | CUTANEOUS | Status: DC

## 2018-09-25 MED ORDER — BUPIVACAINE HCL (PF) 0.25 % IJ SOLN
INTRAMUSCULAR | Status: DC | PRN
  Administered 2018-09-25: 6 mL

## 2018-09-25 MED ORDER — CEFAZOLIN SODIUM-DEXTROSE 2-4 GM/100ML-% IV SOLN
INTRAVENOUS | Status: AC
  Filled 2018-09-25: qty 100

## 2018-09-25 MED ORDER — ONDANSETRON HCL 4 MG/2ML IJ SOLN
INTRAMUSCULAR | Status: DC | PRN
  Administered 2018-09-25: 4 mg via INTRAVENOUS

## 2018-09-25 SURGICAL SUPPLY — 37 items
BANDAGE COBAN STERILE 2 (GAUZE/BANDAGES/DRESSINGS) ×4 IMPLANT
BLADE SURG 15 STRL LF DISP TIS (BLADE) ×2 IMPLANT
BLADE SURG 15 STRL SS (BLADE) ×2
BNDG ESMARK 4X9 LF (GAUZE/BANDAGES/DRESSINGS) IMPLANT
CHLORAPREP W/TINT 26ML (MISCELLANEOUS) ×4 IMPLANT
CORD BIPOLAR FORCEPS 12FT (ELECTRODE) IMPLANT
COVER BACK TABLE 60X90IN (DRAPES) ×4 IMPLANT
COVER MAYO STAND STRL (DRAPES) ×4 IMPLANT
COVER WAND RF STERILE (DRAPES) IMPLANT
CUFF TOURNIQUET SINGLE 18IN (TOURNIQUET CUFF) ×4 IMPLANT
DECANTER SPIKE VIAL GLASS SM (MISCELLANEOUS) ×4 IMPLANT
DRAPE EXTREMITY T 121X128X90 (DRAPE) ×4 IMPLANT
DRAPE SURG 17X23 STRL (DRAPES) ×4 IMPLANT
GAUZE SPONGE 4X4 12PLY STRL (GAUZE/BANDAGES/DRESSINGS) ×4 IMPLANT
GAUZE XEROFORM 1X8 LF (GAUZE/BANDAGES/DRESSINGS) ×4 IMPLANT
GLOVE BIOGEL PI IND STRL 7.0 (GLOVE) ×2 IMPLANT
GLOVE BIOGEL PI IND STRL 8 (GLOVE) ×2 IMPLANT
GLOVE BIOGEL PI INDICATOR 7.0 (GLOVE) ×2
GLOVE BIOGEL PI INDICATOR 8 (GLOVE) ×2
GLOVE SURG ORTHO 8.0 STRL STRW (GLOVE) ×4 IMPLANT
GLOVE SURG SYN 7.5  E (GLOVE) ×2
GLOVE SURG SYN 7.5 E (GLOVE) ×2 IMPLANT
GOWN STRL REIN XL XLG (GOWN DISPOSABLE) ×4 IMPLANT
GOWN STRL REUS W/ TWL LRG LVL3 (GOWN DISPOSABLE) IMPLANT
GOWN STRL REUS W/TWL LRG LVL3 (GOWN DISPOSABLE)
GOWN STRL REUS W/TWL XL LVL3 (GOWN DISPOSABLE) ×4 IMPLANT
NEEDLE PRECISIONGLIDE 27X1.5 (NEEDLE) ×4 IMPLANT
NS IRRIG 1000ML POUR BTL (IV SOLUTION) ×4 IMPLANT
PACK BASIN DAY SURGERY FS (CUSTOM PROCEDURE TRAY) ×4 IMPLANT
PADDING CAST ABS 4INX4YD NS (CAST SUPPLIES) ×2
PADDING CAST ABS COTTON 4X4 ST (CAST SUPPLIES) ×2 IMPLANT
STOCKINETTE 4X48 STRL (DRAPES) ×4 IMPLANT
SUT ETHILON 4 0 PS 2 18 (SUTURE) ×4 IMPLANT
SYR BULB 3OZ (MISCELLANEOUS) ×4 IMPLANT
SYR CONTROL 10ML LL (SYRINGE) ×4 IMPLANT
TOWEL GREEN STERILE FF (TOWEL DISPOSABLE) ×4 IMPLANT
UNDERPAD 30X30 (UNDERPADS AND DIAPERS) ×4 IMPLANT

## 2018-09-25 NOTE — Anesthesia Procedure Notes (Signed)
Anesthesia Regional Block: Bier block (IV Regional)   Pre-Anesthetic Checklist: ,, timeout performed, Correct Patient, Correct Site, Correct Laterality, Correct Procedure,, site marked, surgical consent,, at surgeon's request  Laterality: Right     Needles:  Injection technique: Single-shot  Needle Type: Other      Needle Gauge: 20     Additional Needles:   Procedures:,,,,, intact distal pulses, Esmarch exsanguination, single tourniquet utilized,  Narrative:   Performed by: Personally       

## 2018-09-25 NOTE — Transfer of Care (Signed)
Immediate Anesthesia Transfer of Care Note  Patient: Derek Hanna  Procedure(s) Performed: RELEASE TRIGGER FINGER/A-1 PULLEY RIGHT MIDDLE FINGER (Right Finger)  Patient Location: PACU  Anesthesia Type:MAC and Bier block  Level of Consciousness: awake, alert  and oriented  Airway & Oxygen Therapy: Patient Spontanous Breathing  Post-op Assessment: Report given to RN and Post -op Vital signs reviewed and stable  Post vital signs: Reviewed and stable  Last Vitals:  Vitals Value Taken Time  BP    Temp    Pulse 68 09/25/2018  2:44 PM  Resp 12 09/25/2018  2:44 PM  SpO2 97 % 09/25/2018  2:44 PM  Vitals shown include unvalidated device data.  Last Pain:  Vitals:   09/25/18 1313  TempSrc: Oral  PainSc: 0-No pain         Complications: No apparent anesthesia complications

## 2018-09-25 NOTE — Anesthesia Procedure Notes (Signed)
Procedure Name: MAC Performed by: Terrance Mass, CRNA Pre-anesthesia Checklist: Patient identified, Emergency Drugs available, Suction available, Patient being monitored and Timeout performed Oxygen Delivery Method: Simple face mask

## 2018-09-25 NOTE — Anesthesia Postprocedure Evaluation (Signed)
Anesthesia Post Note  Patient: Derek Hanna  Procedure(s) Performed: RELEASE TRIGGER FINGER/A-1 PULLEY RIGHT MIDDLE FINGER (Right Finger)     Patient location during evaluation: PACU Anesthesia Type: Bier Block Level of consciousness: awake and alert Pain management: pain level controlled Vital Signs Assessment: post-procedure vital signs reviewed and stable Respiratory status: spontaneous breathing, nonlabored ventilation and respiratory function stable Cardiovascular status: blood pressure returned to baseline and stable Postop Assessment: no apparent nausea or vomiting Anesthetic complications: no    Last Vitals:  Vitals:   09/25/18 1455 09/25/18 1500  BP: (!) 131/92   Pulse: 74 71  Resp: 14 20  Temp:    SpO2: 96% 97%    Last Pain:  Vitals:   09/25/18 1455  TempSrc:   PainSc: 0-No pain                 Lucretia Kernarolyn E Witman

## 2018-09-25 NOTE — Op Note (Signed)
NAME: Derek Hanna MEDICAL RECORD NO: [id number] DATE OF BIRTH: 28-Nov-1970 [facility] LOCATION: [facility] PHYSICIAN: Nicki ReaperGARY R. Erick Murin, MD   OPERATIVE REPORT   DATE OF PROCEDURE: 09/25/18    PREOPERATIVE DIAGNOSIS:   Stenosing tenosynovitis right middle finger   POSTOPERATIVE DIAGNOSIS:   Same   PROCEDURE:   Release A1 pulley right middle finger   SURGEON: Cindee SaltGary Gabriela Giannelli, M.D.   ASSISTANT: none   ANESTHESIA:  Bier block with sedation and Local   INTRAVENOUS FLUIDS:  Per anesthesia flow sheet.   ESTIMATED BLOOD LOSS:  Minimal.   COMPLICATIONS:  None.   SPECIMENS:  none   TOURNIQUET TIME:    Total Tourniquet Time Documented: Forearm (Right) - 17 minutes Total: Forearm (Right) - 17 minutes    DISPOSITION:  Stable to PACU.   INDICATIONS: Patient is a 47 year old male with a history of multiple trigger digits.  He has recently begun triggering of his right middle finger is elected to undergo surgical release having had 7 other digits released without relief with injections.  Pre-peri-and postoperative course been discussed along with risks and complications.  He is aware there is no guarantee to the surgery the possibility of infection recurrence injury to arteries nerves tendons and complete relief symptoms and dystrophy.  In the preoperative area the patient is seen extremity marked by both patient and surgeon antibiotic given  OPERATIVE COURSE: Patient is brought to the operating room where a forearm-based IV regional anesthetic was carried out without difficulty under the direction the anesthesia department.  Was prepped with ChloraPrep in the supine position with a right arm free.  I local infiltration was given when he had feeling when the incision was made.  This was oblique in nature over the A1 pulley.  Quarter percent bupivacaine without epinephrine was good approximately 4 cc was used.  The A1 pulley was identified.  Retractors were placed tract  vascular bundles medially medially and ulnarly.  The A1 pulley was then released on its radial aspect.  A small incision was made centrally and A2.  Tenosynovial tissue proximally was separated and excised along with separation of the 2 tendons break any adhesions between the tenosynovial tissue proximally.  The finger was placed through full range of motion no further triggering was noted.  The wound was irrigated and closed with interrupted 4-0 nylon sutures.  Remainder of the is bupivacaine without epinephrine was given approximately total of 7 to 8 cc was used sterile compressive dressing with the fingers 3 was applied.  Deflation of the tourniquet all fingers immediately pink.  He was taken to the recovery room for observation in satisfactory condition.  He will be [place] home [place] [facility] [facility] [place] on Ultram.  He will try Tylenol ibuprofen first.   Cindee SaltGary Quintan Saldivar, MD Electronically signed, 09/25/18

## 2018-09-25 NOTE — Brief Op Note (Signed)
[id number] PeonarryToniann FailCenter Of AlcideMirian [id number] Glassman Alcide78mMiriTandMora Appl Rocke cke cke 1324Doroteo Glassman Drue Dun

## 2018-09-25 NOTE — Discharge Instructions (Addendum)

## 2018-09-25 NOTE — Anesthesia Preprocedure Evaluation (Signed)
Anesthesia Evaluation  Patient identified by MRN, date of birth, ID band Patient awake    Reviewed: Allergy & Precautions, NPO status , Patient's Chart, lab work & pertinent test results  History of Anesthesia Complications Negative for: history of anesthetic complications  Airway Mallampati: I  TM Distance: >3 FB Neck ROM: Full    Dental no notable dental hx.    Pulmonary neg pulmonary ROS,    Pulmonary exam normal        Cardiovascular hypertension, Normal cardiovascular exam     Neuro/Psych negative neurological ROS  negative psych ROS   GI/Hepatic negative GI ROS, Neg liver ROS,   Endo/Other  diabetes, Well Controlled  Renal/GU negative Renal ROS  negative genitourinary   Musculoskeletal negative musculoskeletal ROS (+)   Abdominal   Peds  Hematology negative hematology ROS (+)   Anesthesia Other Findings 47 yo M for trigger finger release - HTN, DM (diet controlled)  Reproductive/Obstetrics                             Anesthesia Physical Anesthesia Plan  ASA: II  Anesthesia Plan: Bier Block and Bier Block-LIDOCAINE ONLY   Post-op Pain Management:    Induction:   PONV Risk Score and Plan: 1 and Propofol infusion, TIVA and Treatment may vary due to age or medical condition  Airway Management Planned: Nasal Cannula and Simple Face Mask  Additional Equipment: None  Intra-op Plan:   Post-operative Plan:   Informed Consent: I have reviewed the patients History and Physical, chart, labs and discussed the procedure including the risks, benefits and alternatives for the proposed anesthesia with the patient or authorized representative who has indicated his/her understanding and acceptance.     Plan Discussed with:   Anesthesia Plan Comments:         Anesthesia Quick Evaluation

## 2018-09-25 NOTE — H&P (Signed)
Derek Hanna is an 47 y.o. male.   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 He comes in today with a new trigger on his right middle finger. Been going on for the past 4 to 5 months. He recalls no history of injury. It is worse in the morning. He has to take his opposite hand to straighten it out. Results in a sharp pain. Is not complaining of any numbness or tingling. States nothing makes it better or worse. He has a history of diabetes no history of thyroid problems arthritis or gout. Family history is similar with a history of diabetes and no gout thyroid problems or arthritis.   Past Medical History:  Diagnosis Date  . Diabetes mellitus without complication (HCC)   . Hyperlipidemia   . Hypertension   . Migraine     Past Surgical History:  Procedure Laterality Date  . HAND SURGERY    . TRIGGER FINGER RELEASE Left 09/25/2017   Procedure: RELEASE TRIGGER FINGER/A-1 PULLEY LEFT MIDDLE FINGER AND LEFT RING FINGER;  Surgeon: Cindee SaltKuzma, Derek Gunkel, MD;  Location: [facility];  Service: Orthopedics;  Laterality: Left;    History reviewed. No pertinent family history. Social History:  reports that he has never smoked. He has never used smokeless tobacco. He reports that he does not drink alcohol or use drugs.  Allergies: No Known Allergies  No medications prior to admission.    No results found for this or any previous visit (from the past 48 hour(s)).  No results found.   Pertinent items are noted in HPI.  Height 5\' 9"  (1.753 m), weight 99.8 kg.  General appearance: alert, cooperative and appears stated age Head: Normocephalic, without obvious abnormality Neck: no JVD Resp: clear to auscultation bilaterally Cardio: regular rate and rhythm, S1, S2 normal, no murmur, click, rub or gallop GI: soft, non-tender; bowel sounds normal; no masses,  no  organomegaly Extremities:  catching right middle finger Pulses: 2+ and symmetric Skin: Skin color, texture, turgor normal. No rashes or lesions Neurologic: Grossly normal Incision/Wound: NA  Assessment/Plan Assessment:  1. Trigger middle finger of right hand    Plan: He states that the injections have never worked in the past and he would like to simply proceed to have this surgically released. Pre-peri-and postoperative course are well-known to him. He is aware that there is no guarantee to the surgery the possibility of infection recurrence injury to arteries nerves tendons complete relief symptoms dystrophy. He is scheduled for release A1 pulley right middle finger as an outpatient under regional anesthesia.   Cindee SaltGary Leyani Hanna 09/25/2018, 7:08 AM

## 2018-09-28 ENCOUNTER — Encounter (HOSPITAL_BASED_OUTPATIENT_CLINIC_OR_DEPARTMENT_OTHER): Payer: Self-pay | Admitting: Orthopedic Surgery

## 2019-09-14 ENCOUNTER — Other Ambulatory Visit: Payer: Self-pay | Admitting: Orthopedic Surgery

## 2019-09-22 ENCOUNTER — Other Ambulatory Visit: Payer: Self-pay

## 2019-09-22 ENCOUNTER — Encounter (HOSPITAL_BASED_OUTPATIENT_CLINIC_OR_DEPARTMENT_OTHER): Payer: Self-pay | Admitting: Orthopedic Surgery

## 2019-09-28 ENCOUNTER — Other Ambulatory Visit: Payer: Self-pay

## 2019-09-28 ENCOUNTER — Encounter (HOSPITAL_BASED_OUTPATIENT_CLINIC_OR_DEPARTMENT_OTHER)
Admission: RE | Admit: 2019-09-28 | Discharge: 2019-09-28 | Disposition: A | Discharge status: Home / Self Care | Payer: 59 | Source: Ambulatory Visit | Attending: Orthopedic Surgery | Admitting: Orthopedic Surgery

## 2019-09-28 DIAGNOSIS — Z01812 Encounter for preprocedural laboratory examination: Secondary | ICD-10-CM | POA: Diagnosis not present

## 2019-09-28 DIAGNOSIS — Z01810 Encounter for preprocedural cardiovascular examination: Secondary | ICD-10-CM | POA: Diagnosis not present

## 2019-09-28 DIAGNOSIS — M65352 Trigger finger, left little finger: Secondary | ICD-10-CM | POA: Diagnosis present

## 2019-09-28 LAB — BASIC METABOLIC PANEL
Anion gap: 9 (ref 5–15)
BUN: 12 mg/dL (ref 6–20)
CO2: 26 mmol/L (ref 22–32)
Calcium: 9.1 mg/dL (ref 8.9–10.3)
Chloride: 106 mmol/L (ref 98–111)
Creatinine, Ser: 1.07 mg/dL (ref 0.61–1.24)
GFR calc Af Amer: >60 mL/min (ref >60)
GFR calc non Af Amer: >60 mL/min (ref >60)
Glucose, Bld: 78 mg/dL (ref 70–99)
Potassium: 4.1 mmol/L (ref 3.5–5.1)
Sodium: 141 mmol/L (ref 135–145)

## 2019-09-28 NOTE — Progress Notes (Signed)

## 2019-10-02 ENCOUNTER — Other Ambulatory Visit (HOSPITAL_COMMUNITY)
Admission: RE | Admit: 2019-10-02 | Discharge: 2019-10-02 | Disposition: A | Discharge status: Home / Self Care | Payer: 59 | Source: Ambulatory Visit | Attending: Orthopedic Surgery | Admitting: Orthopedic Surgery

## 2019-10-02 DIAGNOSIS — Z01812 Encounter for preprocedural laboratory examination: Secondary | ICD-10-CM | POA: Insufficient documentation

## 2019-10-02 DIAGNOSIS — Z20822 Contact with and (suspected) exposure to covid-19: Secondary | ICD-10-CM | POA: Diagnosis not present

## 2019-10-02 LAB — SARS CORONAVIRUS 2 (TAT 6-24 HRS): SARS Coronavirus 2: NEGATIVE

## 2019-10-05 ENCOUNTER — Encounter (HOSPITAL_BASED_OUTPATIENT_CLINIC_OR_DEPARTMENT_OTHER)
Admission: RE | Disposition: A | Discharge status: Home / Self Care | Payer: Self-pay | Source: Home / Self Care | Attending: Orthopedic Surgery

## 2019-10-05 ENCOUNTER — Ambulatory Visit (HOSPITAL_BASED_OUTPATIENT_CLINIC_OR_DEPARTMENT_OTHER)
Admission: RE | Admit: 2019-10-05 | Discharge: 2019-10-05 | Disposition: A | Discharge status: Home / Self Care | Payer: 59 | Attending: Orthopedic Surgery | Admitting: Orthopedic Surgery

## 2019-10-05 ENCOUNTER — Encounter (HOSPITAL_BASED_OUTPATIENT_CLINIC_OR_DEPARTMENT_OTHER): Payer: Self-pay | Admitting: Orthopedic Surgery

## 2019-10-05 ENCOUNTER — Ambulatory Visit (HOSPITAL_BASED_OUTPATIENT_CLINIC_OR_DEPARTMENT_OTHER): Payer: 59 | Admitting: Certified Registered"

## 2019-10-05 ENCOUNTER — Other Ambulatory Visit: Payer: Self-pay

## 2019-10-05 DIAGNOSIS — M65352 Trigger finger, left little finger: Secondary | ICD-10-CM | POA: Diagnosis not present

## 2019-10-05 DIAGNOSIS — Z01810 Encounter for preprocedural cardiovascular examination: Secondary | ICD-10-CM | POA: Insufficient documentation

## 2019-10-05 DIAGNOSIS — Z01812 Encounter for preprocedural laboratory examination: Secondary | ICD-10-CM | POA: Insufficient documentation

## 2019-10-05 HISTORY — PX: TRIGGER FINGER RELEASE: SHX641

## 2019-10-05 LAB — GLUCOSE, CAPILLARY
Glucose-Capillary: 109 mg/dL — ABNORMAL HIGH (ref 70–99)
Glucose-Capillary: 110 mg/dL — ABNORMAL HIGH (ref 70–99)

## 2019-10-05 SURGERY — RELEASE, A1 PULLEY, FOR TRIGGER FINGER
Anesthesia: Regional | Site: Finger | Laterality: Left

## 2019-10-05 MED ORDER — CEFAZOLIN SODIUM-DEXTROSE 2-4 GM/100ML-% IV SOLN
INTRAVENOUS | Status: AC
  Filled 2019-10-05: qty 100

## 2019-10-05 MED ORDER — CHLORHEXIDINE GLUCONATE 4 % EX LIQD: 60.0000 mL | Freq: Once | CUTANEOUS | Status: DC

## 2019-10-05 MED ORDER — ACETAMINOPHEN 160 MG/5ML PO SOLN: 325.0000 mg | ORAL | Status: DC | PRN

## 2019-10-05 MED ORDER — 0.9 % SODIUM CHLORIDE (POUR BTL) OPTIME
TOPICAL | Status: DC | PRN
  Administered 2019-10-05: 1000 mL

## 2019-10-05 MED ORDER — KETOROLAC TROMETHAMINE 30 MG/ML IJ SOLN
INTRAMUSCULAR | Status: AC
  Filled 2019-10-05: qty 1

## 2019-10-05 MED ORDER — ONDANSETRON HCL 4 MG/2ML IJ SOLN
INTRAMUSCULAR | Status: DC | PRN
  Administered 2019-10-05: 4 mg via INTRAVENOUS

## 2019-10-05 MED ORDER — KETOROLAC TROMETHAMINE 30 MG/ML IJ SOLN: 30.0000 mg | Freq: Once | INTRAMUSCULAR | Status: DC | PRN

## 2019-10-05 MED ORDER — CEFAZOLIN SODIUM-DEXTROSE 2-4 GM/100ML-% IV SOLN
2.0000 g | INTRAVENOUS | Status: AC
  Administered 2019-10-05: 2 g via INTRAVENOUS

## 2019-10-05 MED ORDER — FENTANYL CITRATE (PF) 100 MCG/2ML IJ SOLN
INTRAMUSCULAR | Status: AC
  Filled 2019-10-05: qty 2

## 2019-10-05 MED ORDER — PROPOFOL 500 MG/50ML IV EMUL
INTRAVENOUS | Status: DC | PRN
  Administered 2019-10-05: 75 ug/kg/min via INTRAVENOUS

## 2019-10-05 MED ORDER — ONDANSETRON HCL 4 MG/2ML IJ SOLN: 4.0000 mg | Freq: Once | INTRAMUSCULAR | Status: DC | PRN

## 2019-10-05 MED ORDER — KETOROLAC TROMETHAMINE 30 MG/ML IJ SOLN
INTRAMUSCULAR | Status: DC | PRN
  Administered 2019-10-05: 30 mg via INTRAVENOUS

## 2019-10-05 MED ORDER — OXYCODONE HCL 5 MG PO TABS: 5.0000 mg | ORAL_TABLET | Freq: Once | ORAL | Status: DC | PRN

## 2019-10-05 MED ORDER — ONDANSETRON HCL 4 MG/2ML IJ SOLN
INTRAMUSCULAR | Status: AC
  Filled 2019-10-05: qty 2

## 2019-10-05 MED ORDER — PROPOFOL 10 MG/ML IV BOLUS
INTRAVENOUS | Status: AC
  Filled 2019-10-05: qty 20

## 2019-10-05 MED ORDER — ACETAMINOPHEN 325 MG PO TABS: 325.0000 mg | ORAL_TABLET | ORAL | Status: DC | PRN

## 2019-10-05 MED ORDER — LACTATED RINGERS IV SOLN: INTRAVENOUS | Status: DC

## 2019-10-05 MED ORDER — MEPERIDINE HCL 25 MG/ML IJ SOLN: 6.2500 mg | INTRAMUSCULAR | Status: DC | PRN

## 2019-10-05 MED ORDER — MIDAZOLAM HCL 2 MG/2ML IJ SOLN
INTRAMUSCULAR | Status: AC
  Filled 2019-10-05: qty 2

## 2019-10-05 MED ORDER — BUPIVACAINE HCL (PF) 0.25 % IJ SOLN
INTRAMUSCULAR | Status: DC | PRN
  Administered 2019-10-05: 6 mL

## 2019-10-05 MED ORDER — LIDOCAINE HCL (CARDIAC) PF 100 MG/5ML IV SOSY
PREFILLED_SYRINGE | INTRAVENOUS | Status: DC | PRN
  Administered 2019-10-05: 35 mL via INTRAVENOUS

## 2019-10-05 MED ORDER — MIDAZOLAM HCL 5 MG/5ML IJ SOLN
INTRAMUSCULAR | Status: DC | PRN
  Administered 2019-10-05: 2 mg via INTRAVENOUS

## 2019-10-05 MED ORDER — OXYCODONE HCL 5 MG/5ML PO SOLN: 5.0000 mg | Freq: Once | ORAL | Status: DC | PRN

## 2019-10-05 MED ORDER — FENTANYL CITRATE (PF) 100 MCG/2ML IJ SOLN: 25.0000 ug | INTRAMUSCULAR | Status: DC | PRN

## 2019-10-05 MED ORDER — FENTANYL CITRATE (PF) 100 MCG/2ML IJ SOLN
INTRAMUSCULAR | Status: DC | PRN
  Administered 2019-10-05: 50 ug via INTRAVENOUS
  Administered 2019-10-05 (×2): 25 ug via INTRAVENOUS

## 2019-10-05 SURGICAL SUPPLY — 30 items
BLADE SURG 15 STRL LF DISP TIS (BLADE) ×1 IMPLANT
BLADE SURG 15 STRL SS (BLADE) ×2
BNDG COHESIVE 2X5 TAN STRL LF (GAUZE/BANDAGES/DRESSINGS) ×3 IMPLANT
BNDG ESMARK 4X9 LF (GAUZE/BANDAGES/DRESSINGS) IMPLANT
CHLORAPREP W/TINT 26 (MISCELLANEOUS) ×3 IMPLANT
CORD BIPOLAR FORCEPS 12FT (ELECTRODE) IMPLANT
COVER BACK TABLE REUSABLE LG (DRAPES) ×3 IMPLANT
COVER MAYO STAND REUSABLE (DRAPES) ×3 IMPLANT
COVER WAND RF STERILE (DRAPES) IMPLANT
CUFF TOURN SGL QUICK 18X4 (TOURNIQUET CUFF) IMPLANT
DECANTER SPIKE VIAL GLASS SM (MISCELLANEOUS) IMPLANT
DRAPE EXTREMITY T 121X128X90 (DISPOSABLE) ×3 IMPLANT
DRAPE SURG 17X23 STRL (DRAPES) ×3 IMPLANT
GAUZE SPONGE 4X4 12PLY STRL (GAUZE/BANDAGES/DRESSINGS) ×3 IMPLANT
GAUZE XEROFORM 1X8 LF (GAUZE/BANDAGES/DRESSINGS) ×3 IMPLANT
GLOVE BIOGEL PI IND STRL 8.5 (GLOVE) ×1 IMPLANT
GLOVE BIOGEL PI INDICATOR 8.5 (GLOVE) ×2
GLOVE SURG ORTHO 8.0 STRL STRW (GLOVE) ×3 IMPLANT
GOWN STRL REUS W/ TWL LRG LVL3 (GOWN DISPOSABLE) ×1 IMPLANT
GOWN STRL REUS W/TWL LRG LVL3 (GOWN DISPOSABLE) ×2
GOWN STRL REUS W/TWL XL LVL3 (GOWN DISPOSABLE) ×3 IMPLANT
NEEDLE PRECISIONGLIDE 27X1.5 (NEEDLE) ×3 IMPLANT
NS IRRIG 1000ML POUR BTL (IV SOLUTION) ×3 IMPLANT
PACK BASIN DAY SURGERY FS (CUSTOM PROCEDURE TRAY) ×3 IMPLANT
STOCKINETTE 4X48 STRL (DRAPES) ×3 IMPLANT
SUT ETHILON 4 0 PS 2 18 (SUTURE) ×3 IMPLANT
SYR BULB 3OZ (MISCELLANEOUS) ×3 IMPLANT
SYR CONTROL 10ML LL (SYRINGE) ×3 IMPLANT
TOWEL GREEN STERILE FF (TOWEL DISPOSABLE) ×6 IMPLANT
UNDERPAD 30X36 HEAVY ABSORB (UNDERPADS AND DIAPERS) ×3 IMPLANT

## 2019-10-05 NOTE — H&P (Signed)
  Derek Hanna is an 49 y.o. male.   Chief Complaint: catchingleft small finger HPI: Derek Hanna is a 49 yo male complaining of pain in the left small finger. He was last seen on 11/06/2018. He has status post trigger finger releases on his right middle finger. He had a trigger finger release on his left middle finger in 2018. He states his left small finger began triggering approximately 3 months ago. He is complaining of moderate to severe discomfort if it locks. It is worse in the morning. He has no history of injury. Nothing makes it better or worse. He complains of pain to the metacarpal phalangeal joint left small finger. He has no history of thyroid problems arthritis or gout. Does have history of diabetes. Family history is positive diabetes negative for the remainder.   Past Medical History:  Diagnosis Date  . Diabetes mellitus without complication (HCC)   . Hyperlipidemia   . Hypertension   . Migraine     Past Surgical History:  Procedure Laterality Date  . HAND SURGERY    . TRIGGER FINGER RELEASE Left 09/25/2017   Procedure: RELEASE TRIGGER FINGER/A-1 PULLEY LEFT MIDDLE FINGER AND LEFT RING FINGER;  Surgeon: Cindee Salt, MD;  Location: [facility];  Service: Orthopedics;  Laterality: Left;  . TRIGGER FINGER RELEASE Right 09/25/2018   Procedure: RELEASE TRIGGER FINGER/A-1 PULLEY RIGHT MIDDLE FINGER;  Surgeon: Cindee Salt, MD;  Location: [facility];  Service: Orthopedics;  Laterality: Right;    No family history on file. Social History:  reports that he has never smoked. He has never used smokeless tobacco. He reports that he does not drink alcohol or use drugs.  Allergies: No Known Allergies  No medications prior to admission.    No results found for this or any previous visit (from the past 48 hour(s)).  No results found.   Pertinent items are noted in HPI.  Height 5\' 9"  (1.753 m), weight 102.1 kg.  General appearance: alert, cooperative and  appears stated age Head: Normocephalic, without obvious abnormality Neck: no JVD Resp: clear to auscultation bilaterally Cardio: regular rate and rhythm, S1, S2 normal, no murmur, click, rub or gallop GI: soft, non-tender; bowel sounds normal; no masses,  no organomegaly Extremities: catching left small finger Pulses: 2+ and symmetric Skin: Skin color, texture, turgor normal. No rashes or lesions Neurologic: Grossly normal Incision/Wound: na  Assessment/Plan Assessment:  1. Trigger little finger of left hand    Plan: Having had multiple digits released in the past he does not want to try any injections. He is scheduled for release A1 pulley left small finger as an outpatient under regional anesthesia. Preperi-and postoperative course are discussed along with risk and complications. He is aware there is no guarantee of the surgery possibility of infection recurrence injury to arteries nerves tendons incomplete relief of symptoms dystrophy. He is scheduled for release A1 pulley left small finger as an outpatient under regional anesthesia.     10/05/2019, 5:45 AM

## 2019-10-05 NOTE — Op Note (Signed)
NAME: Derek Hanna MEDICAL RECORD NO: [id number] DATE OF BIRTH: September 03, 1971 FACILITY: [facility] LOCATION: [facility] PHYSICIAN: Nicki Reaper, MD   OPERATIVE REPORT   DATE OF PROCEDURE: 10/05/19    PREOPERATIVE DIAGNOSIS:   Stenosing tenosynovitis left small finger POSTOPERATIVE DIAGNOSIS:  same   PROCEDURE:   Release A1 pulley left small finger   SURGEON: Cindee Salt, M.D.   ASSISTANT: none   ANESTHESIA:  Bier block with sedation and Local   INTRAVENOUS FLUIDS:  Per anesthesia flow sheet.   ESTIMATED BLOOD LOSS:  Minimal.   COMPLICATIONS:  None.   SPECIMENS:  none   TOURNIQUET TIME:    Total Tourniquet Time Documented: Forearm (Left) - 19 minutes Total: Forearm (Left) - 19 minutes    DISPOSITION:  Stable to PACU.   INDICATIONS: Patient is a 49 year old male with a history of triggering multiple digits.  He has developed triggering of his left small finger.  His foregone injections and that they have not worked in the past for him.  Preperi-and postoperative course are well-known to him having had 6 digits released in the past.  He is aware that there is no guarantee to the surgery possibility of infection recurrence injury to arteries nerves tendons complete relief symptoms dystrophy.  Preoperative area the patient seen the extremity marked by both patient and surgeon antibiotic given  OPERATIVE COURSE: Patient is brought to the operating room where form based IV regional anesthetic was carried out without difficulty under the direction of the anesthesia department.  He was prepped using ChloraPrep in the supine position with the left arm free.  A 3-minute dry time was allowed timeout taken to confirm patient procedure.  An oblique incision was made over the A1 pulley of the left small finger carried down through subcutaneous tissue.  Bleeders were electrocauterized necessary with bipolar.  Retractors were placed retracting neurovascular bundles radially and  ulnarly.  The A1 pulley was identified this was then released on its radial aspect.  A small incision was made centrally and A2.  Tenosynovial tissue proximally was separated with blunt dissection..  The 2 tendons were then separated with traction breaking any adhesions.  Finger was placed into full passive range of motion no further trigger was noted.  The wound was copiously irrigated with saline and closed with interrupted 4-0 nylon sutures.  Local infiltration quarter percent bupivacaine without epinephrine was given.  Sterile compressive dressing with fingers 3 was applied.  Deflation of the tourniquet all fingers immediately pink.  He was taken to the recovery room for observation in satisfactory condition.  He will be discharged home to return [facility] in 1 week Tylenol ibuprofen for discomfort.  He was offered Ultram but has not taken it in the past and declined.   Cindee Salt, MD Electronically signed, 10/05/19

## 2019-10-05 NOTE — Anesthesia Preprocedure Evaluation (Addendum)
Anesthesia Evaluation  Patient identified by MRN, date of birth, ID band Patient awake    Reviewed: Allergy & Precautions, NPO status , Patient's Chart, lab work & pertinent test results  History of Anesthesia Complications Negative for: history of anesthetic complications  Airway Mallampati: I  TM Distance: >3 FB Neck ROM: Full    Dental no notable dental hx. (+) Teeth Intact   Pulmonary neg pulmonary ROS,    Pulmonary exam normal breath sounds clear to auscultation       Cardiovascular hypertension, Pt. on medications Normal cardiovascular exam Rhythm:Regular Rate:Normal     Neuro/Psych  Headaches, negative psych ROS   GI/Hepatic negative GI ROS, Neg liver ROS,   Endo/Other  diabetes, Well Controlled, Oral Hypoglycemic Agents  Renal/GU negative Renal ROS  negative genitourinary   Musculoskeletal negative musculoskeletal ROS (+)   Abdominal (+) + obese,   Peds  Hematology negative hematology ROS (+)   Anesthesia Other Findings 49 yo M for trigger finger release - HTN, DM (diet controlled)  Reproductive/Obstetrics                             Anesthesia Physical  Anesthesia Plan  ASA: II  Anesthesia Plan: Bier Block and MAC and Bier Block-LIDOCAINE ONLY   Post-op Pain Management:    Induction:   PONV Risk Score and Plan: 1 and Propofol infusion, TIVA, Treatment may vary due to age or medical condition and Midazolam  Airway Management Planned: Nasal Cannula, Simple Face Mask and Natural Airway  Additional Equipment: None  Intra-op Plan:   Post-operative Plan:   Informed Consent: I have reviewed the patients History and Physical, chart, labs and discussed the procedure including the risks, benefits and alternatives for the proposed anesthesia with the patient or authorized representative who has indicated his/her understanding and acceptance.       Plan Discussed with:  CRNA  Anesthesia Plan Comments:        Anesthesia Quick Evaluation

## 2019-10-05 NOTE — Discharge Instructions (Addendum)

## 2019-10-05 NOTE — Anesthesia Postprocedure Evaluation (Signed)
Anesthesia Post Note  Patient: Derek Hanna  Procedure(s) Performed: RELEASE TRIGGER FINGER/A-1 PULLEY LEFT SMALL FINGER (Left Finger)     Patient location during evaluation: PACU Anesthesia Type: Bier Block and MAC Level of consciousness: awake Pain management: pain level controlled Vital Signs Assessment: post-procedure vital signs reviewed and stable Respiratory status: spontaneous breathing Cardiovascular status: stable Postop Assessment: no apparent nausea or vomiting Anesthetic complications: no    Last Vitals:  Vitals:   10/05/19 0918 10/05/19 0930  BP:  (!) 169/85  Pulse: 78 74  Resp: 13 12  Temp:    SpO2: 98% 95%    Last Pain:  Vitals:   10/05/19 0930  TempSrc:   PainSc: 0-No pain   Pain Goal:                   Huston Foley

## 2019-10-05 NOTE — Brief Op Note (Signed)
10/05/2019  9:20 AM  PATIENT:  Derek Hanna  49 y.o. male  PRE-OPERATIVE DIAGNOSIS:  TRIGGER LEFT SMALL FINGER  POST-OPERATIVE DIAGNOSIS:  TRIGGER LEFT SMALL FINGER  PROCEDURE:  Procedure(s): RELEASE TRIGGER FINGER/A-1 PULLEY LEFT SMALL FINGER (Left)  SURGEON:  Surgeon(s) and Role:    * Cindee Salt, MD - Primary  PHYSICIAN ASSISTANT:   ASSISTANTS: none   ANESTHESIA:   local, regional and IV sedation  EBL: 71ml  BLOOD ADMINISTERED:none  DRAINS: none   LOCAL MEDICATIONS USED:  BUPIVICAINE   SPECIMEN:  No Specimen  DISPOSITION OF SPECIMEN:  N/A  COUNTS:  YES  TOURNIQUET:   Total Tourniquet Time Documented: Forearm (Left) - 19 minutes Total: Forearm (Left) - 19 minutes   DICTATION: .Reubin Milan Dictation  PLAN OF CARE: Discharge to home after PACU  PATIENT DISPOSITION:  PACU - hemodynamically stable.

## 2019-10-05 NOTE — Transfer of Care (Signed)
Immediate Anesthesia Transfer of Care Note  Patient: Derek Hanna  Procedure(s) Performed: RELEASE TRIGGER FINGER/A-1 PULLEY LEFT SMALL FINGER (Left Finger)  Patient Location: PACU  Anesthesia Type:MAC and Bier block  Level of Consciousness: awake, alert  and oriented  Airway & Oxygen Therapy: Patient Spontanous Breathing  Post-op Assessment: Report given to RN and Post -op Vital signs reviewed and stable  Post vital signs: Reviewed and stable  Last Vitals:  Vitals Value Taken Time  BP 160/77 10/05/19 0917  Temp    Pulse 84 10/05/19 0919  Resp 17 10/05/19 0919  SpO2 96 % 10/05/19 0919  Vitals shown include unvalidated device data.  Last Pain:  Vitals:   10/05/19 0805  TempSrc: Temporal  PainSc: 6          Complications: No apparent anesthesia complications

## 2019-10-06 ENCOUNTER — Encounter: Payer: Self-pay | Admitting: *Deleted

## 2020-06-02 ENCOUNTER — Other Ambulatory Visit: Payer: Self-pay | Admitting: Family Medicine

## 2020-06-02 DIAGNOSIS — R221 Localized swelling, mass and lump, neck: Secondary | ICD-10-CM

## 2020-06-12 ENCOUNTER — Other Ambulatory Visit: Payer: Self-pay

## 2020-06-12 ENCOUNTER — Ambulatory Visit
Admission: RE | Admit: 2020-06-12 | Discharge: 2020-06-12 | Disposition: A | Discharge status: Home / Self Care | Payer: 59 | Source: Ambulatory Visit | Attending: Family Medicine | Admitting: Family Medicine

## 2020-06-12 DIAGNOSIS — R221 Localized swelling, mass and lump, neck: Secondary | ICD-10-CM

## 2022-07-01 IMAGING — US US THYROID
1 series · 14 of 25 positions shown · non-contrast
Comparison: None.

CLINICAL DATA: Palpable abnormality. Palpable abnormality within
the neck. Evaluate for thyromegaly and or thyroid nodule.

EXAM:
THYROID ULTRASOUND
TECHNIQUE: Ultrasound examination of the thyroid gland and adjacent soft
tissues was performed.

[Series 1: us thyroid · 0.08mm/px · 14 of 42 slices shown]
[im 1/42]
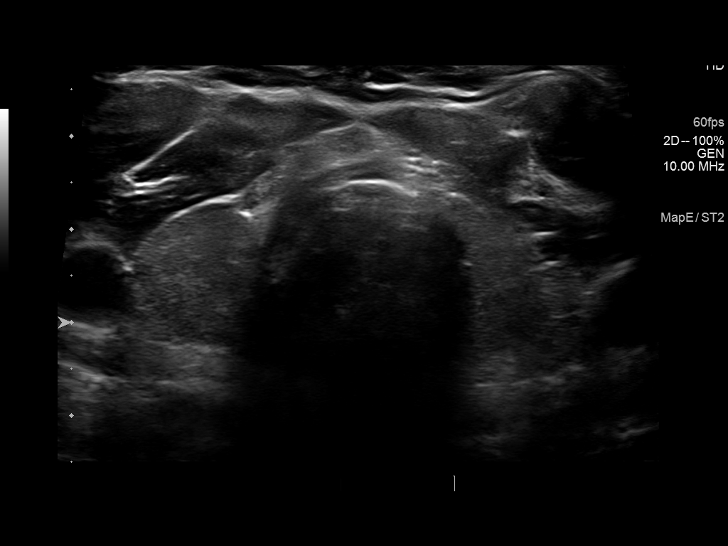
[im 4/42]
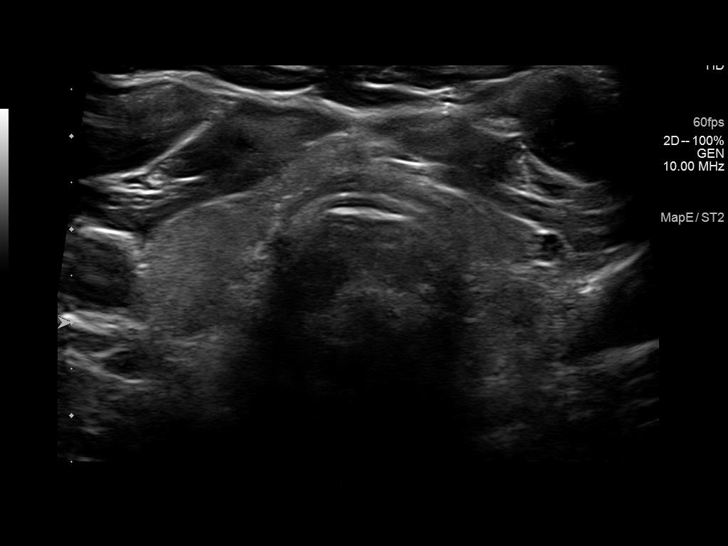
[im 7/42]
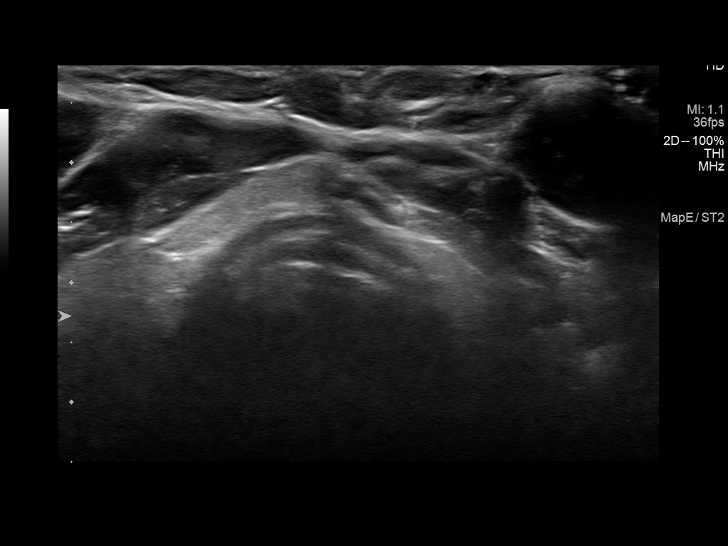
[im 11/42]
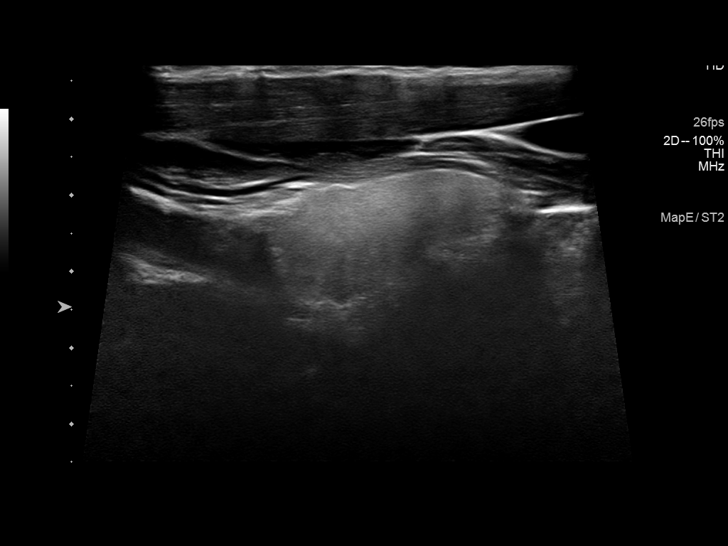
[im 14/42]
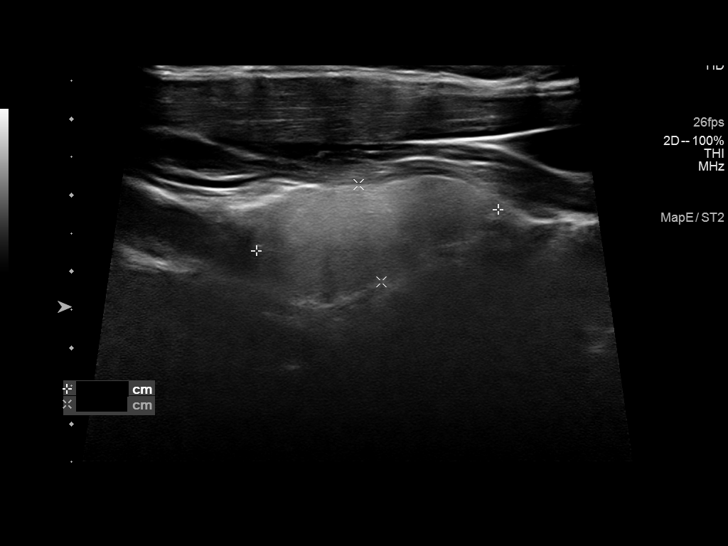
[im 16/42]
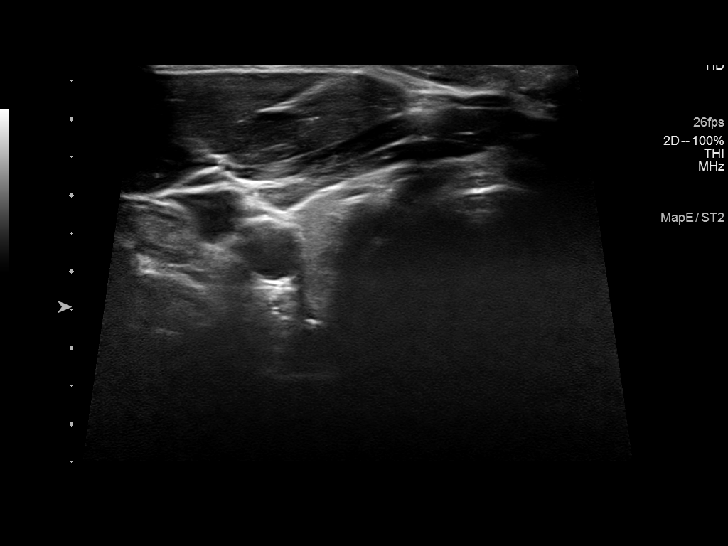
[im 19/42]
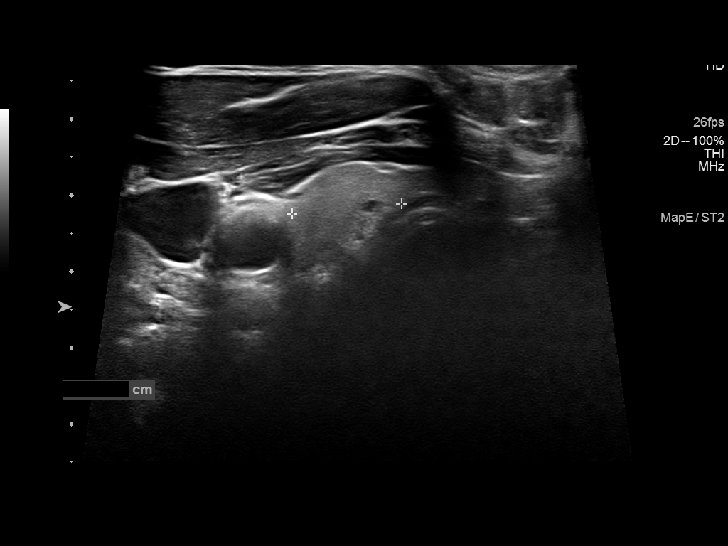
[im 23/42]
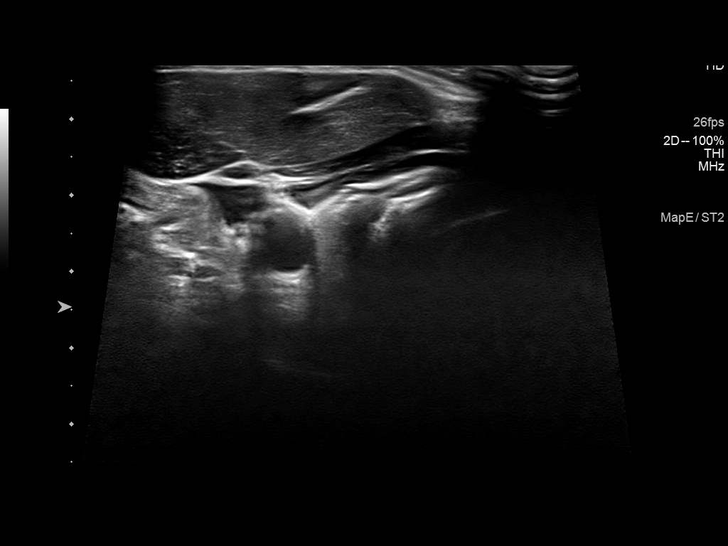
[im 26/42]
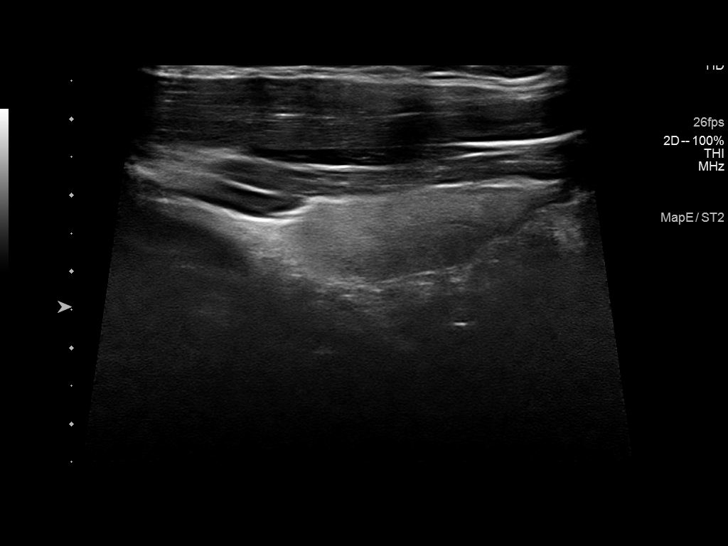
[im 28/42]
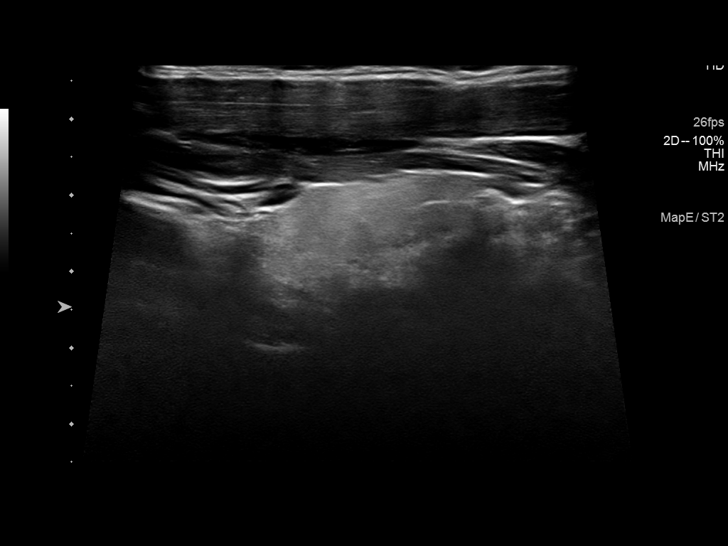
[im 31/42]
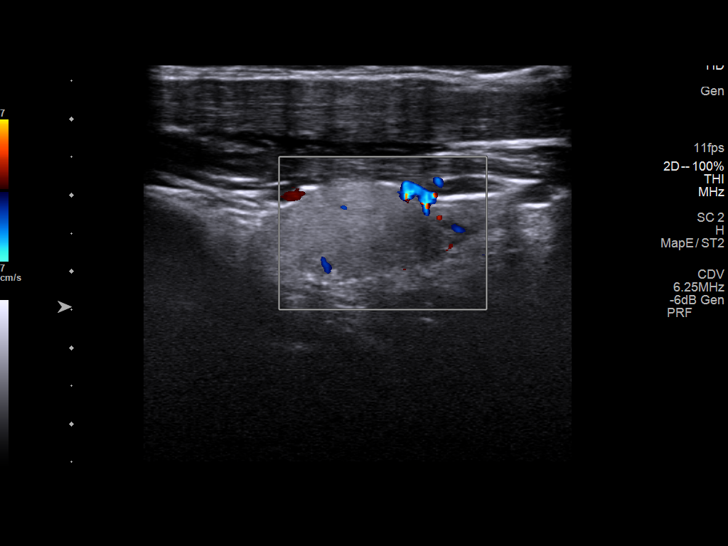
[im 35/42]
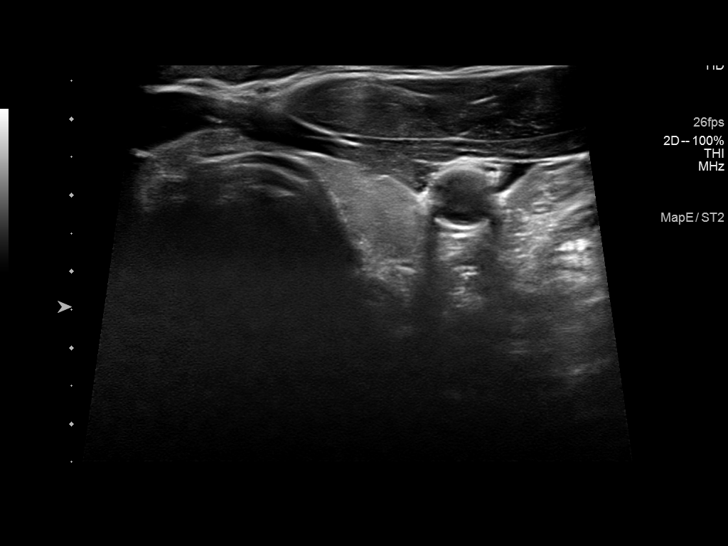
[im 38/42]
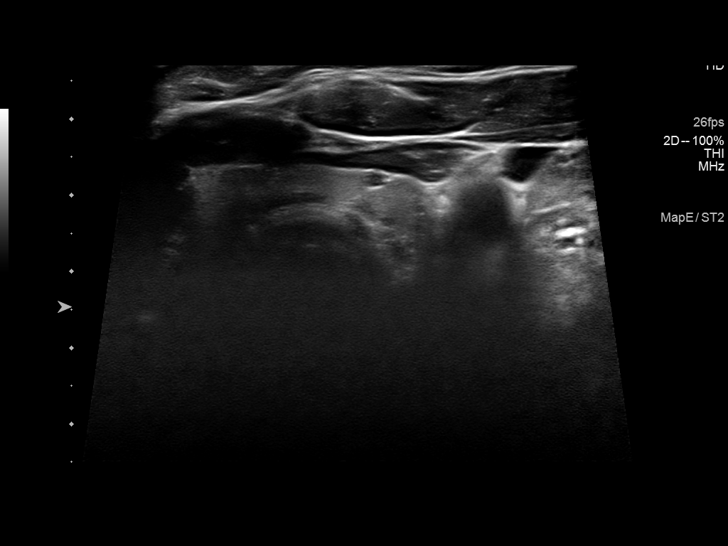
[im 42/42]
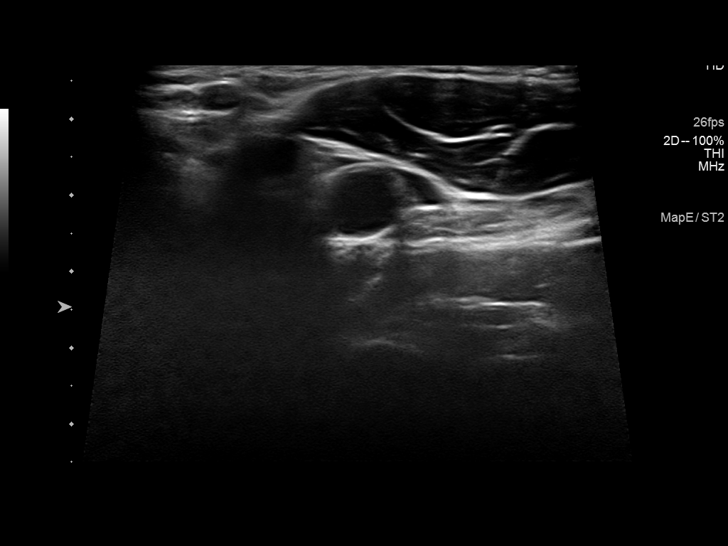

[14 of 25 positions shown; findings below may reference images not displayed]

FINDINGS: Parenchymal Echotexture: Normal

Isthmus: Normal in size measures 0.4 cm in diameter

Right lobe: Slightly diminutive in size measuring 3.2 x 1.3 x 1.4 cm

Left lobe: Slightly diminutive in size measuring 3.1 x 1.2 x 1.1 cm

_________________________________________________________

Estimated total number of nodules >/= 1 cm: 0

Number of spongiform nodules >/=  2 cm not described below (TR1): 0

Number of mixed cystic and solid nodules >/= 1.5 cm not described
below (TR2): 0

_________________________________________________________

No discrete nodules are seen within the thyroid gland.

No regional cervical lymphadenopathy.
IMPRESSION: 1. Slightly diminutive but otherwise normal appearing thyroid
without discrete nodule or mass.
2. No regional cervical lymphadenopathy.

## 2022-08-15 NOTE — Progress Notes (Signed)
Chief Complaint  Patient presents with   New Patient (Initial Visit)    Chest pain    History of Present Illness: 51 yo male with history of DM, HTN, hyperlipidemia here today as a new consult, referred by Dr. Manus Gunning, for the evaluation of chest pain. He tells me that he has been been having chest pain and dyspnea with moderate exertion. The pain resolves with rest. No dizziness, LE edema, palpitations. He is a Chemical engineer. He has never smoked and does not use drugs. He has no family history of CAD.   Primary Care Physician: Blair Heys, MD   Past Medical History:  Diagnosis Date   Diabetes mellitus without complication (HCC)    Gout    H/O arthroscopic knee surgery    Hyperlipidemia    Hypertension    Migraine    Obesity     Past Surgical History:  Procedure Laterality Date   HAND SURGERY     TRIGGER FINGER RELEASE Left 09/25/2017   Procedure: RELEASE TRIGGER FINGER/A-1 PULLEY LEFT MIDDLE FINGER AND LEFT RING FINGER;  Surgeon: Cindee Salt, MD;  Location: [facility];  Service: Orthopedics;  Laterality: Left;   TRIGGER FINGER RELEASE Right 09/25/2018   Procedure: RELEASE TRIGGER FINGER/A-1 PULLEY RIGHT MIDDLE FINGER;  Surgeon: Cindee Salt, MD;  Location: [facility];  Service: Orthopedics;  Laterality: Right;   TRIGGER FINGER RELEASE Left 10/05/2019   Procedure: RELEASE TRIGGER FINGER/A-1 PULLEY LEFT SMALL FINGER;  Surgeon: Cindee Salt, MD;  Location: [facility];  Service: Orthopedics;  Laterality: Left;    Current Outpatient Medications  Medication Sig Dispense Refill   aspirin 81 MG chewable tablet Chew by mouth daily.     atorvastatin (LIPITOR) 10 MG tablet Take 10 mg by mouth daily.     lisinopril-hydrochlorothiazide (PRINZIDE,ZESTORETIC) 20-12.5 MG per tablet Take 1 tablet by mouth daily.     metFORMIN (GLUCOPHAGE) 500 MG tablet Take by mouth 2 (two) times daily with a meal.     metoprolol tartrate (LOPRESSOR) 50  MG tablet Take 1 tablet (50 mg total) by mouth once for 1 dose. Take 90-120 minutes prior to scan. 1 tablet 0   No current facility-administered medications for this visit.    No Known Allergies  Social History   Socioeconomic History   Marital status: Married    Spouse name: Not on file   Number of children: 2   Years of education: Not on file   Highest education level: Not on file  Occupational History   Occupation: TEFL teacher  Tobacco Use   Smoking status: Never   Smokeless tobacco: Never  Vaping Use   Vaping Use: Never used  Substance and Sexual Activity   Alcohol use: No   Drug use: No   Sexual activity: Not on file  Other Topics Concern   Not on file  Social History Narrative   Not on file   Social Determinants of Health   Financial Resource Strain: Not on file  Food Insecurity: Not on file  Transportation Needs: Not on file  Physical Activity: Not on file  Stress: Not on file  Social Connections: Not on file  Intimate Partner Violence: Not on file    Family History  Problem Relation Age of Onset   Cancer Mother    Cancer Sister     Review of Systems:  As stated in the HPI and otherwise negative.   BP (!) 148/80   Pulse 64   Ht  5\' 9"  (1.753 m)   Wt 216 lb (98 kg)   SpO2 98%   BMI 31.90 kg/m   Physical Examination: General: Well developed, well nourished, NAD  HEENT: OP clear, mucus membranes moist  SKIN: warm, dry. No rashes. Neuro: No focal deficits  Musculoskeletal: Muscle strength 5/5 all ext  Psychiatric: Mood and affect normal  Neck: No JVD, no carotid bruits, no thyromegaly, no lymphadenopathy.  Lungs:Clear bilaterally, no wheezes, rhonci, crackles Cardiovascular: Regular rate and rhythm. No murmurs, gallops or rubs. Abdomen:Soft. Bowel sounds present. Non-tender.  Extremities: No lower extremity edema. Pulses are 2 + in the bilateral DP/PT.  EKG:  EKG is ordered today. The ekg ordered today demonstrates Sinus. Non-specific T  wave abnormality  Recent Labs: No results found for requested labs within last 365 days.   Lipid Panel No results found for: "CHOL", "TRIG", "HDL", "CHOLHDL", "VLDL", "LDLCALC", "LDLDIRECT"   Wt Readings from Last 3 Encounters:  08/16/22 216 lb (98 kg)  10/05/19 220 lb 3.8 oz (99.9 kg)  09/25/18 212 lb 1.3 oz (96.2 kg)    Assessment and Plan:   1. Chest pain/Dyspnea: He has exertional dyspnea and chest pain. Risk factors for CAD are DM, HTN, HLD. Will arrange a cardiac CTA to exclude CAD. Will arrange an echo to assess LVEF and exclude structural heart disease. BMET today.   Labs/ tests ordered today include:   Orders Placed This Encounter  Procedures   CT CORONARY MORPH W/CTA COR W/SCORE W/CA W/CM &/OR WO/CM   Basic Metabolic Panel (BMET)   EKG 12-Lead   ECHOCARDIOGRAM COMPLETE   Disposition:   F/U with me as needed   Signed, 09/27/18, MD, [facility] 08/16/2022 9:24 AM    [facility] [address] [place], [place], [place]  [place] Phone: [phone]; Fax: [phone]

## 2022-08-16 ENCOUNTER — Encounter: Payer: Self-pay | Admitting: Cardiovascular Disease

## 2022-08-16 ENCOUNTER — Ambulatory Visit: Payer: 59 | Attending: Cardiovascular Disease | Admitting: Cardiovascular Disease

## 2022-08-16 VITALS — BP 148/80 | HR 64 | Ht 69.0 in | Wt 216.0 lb

## 2022-08-16 DIAGNOSIS — R079 Chest pain, unspecified: Secondary | ICD-10-CM | POA: Diagnosis not present

## 2022-08-16 DIAGNOSIS — Z01812 Encounter for preprocedural laboratory examination: Secondary | ICD-10-CM

## 2022-08-16 DIAGNOSIS — R072 Precordial pain: Secondary | ICD-10-CM | POA: Diagnosis not present

## 2022-08-16 DIAGNOSIS — R0602 Shortness of breath: Secondary | ICD-10-CM | POA: Diagnosis not present

## 2022-08-16 MED ORDER — METOPROLOL TARTRATE 50 MG PO TABS: 50.0000 mg | ORAL_TABLET | Freq: Once | ORAL | 0 refills | Status: AC

## 2022-08-16 NOTE — Patient Instructions (Addendum)
Medication Instructions:  No changes *If you need a refill on your cardiac medications before your next appointment, please call your pharmacy*   Lab Work: Today: BMET   Testing/Procedures: Your physician has requested that you have an echocardiogram. Echocardiography is a painless test that uses sound waves to create images of your heart. It provides your doctor with information about the size and shape of your heart and how well your heart's chambers and valves are working. This procedure takes approximately one hour. There are no restrictions for this procedure. Please do NOT wear cologne, perfume, aftershave, or lotions (deodorant is allowed). Please arrive 15 minutes prior to your appointment time.  Cardiac CTA - see instructions below   Follow-Up: As needed  Other Instructions   Your cardiac CT will be scheduled at  [facility] [address] [place], [place] [address] [phone]  Please arrive at the [facility] Entrance (Entrance C2) of [facility] 30 minutes prior to test start time. You can use the FREE valet parking offered at entrance C (encouraged to control the heart rate for the test)  Proceed to the [facility] Radiology Department (first floor) to check-in and test prep.  All radiology patients and guests should use entrance C2 at [facility], accessed from [facility], even though the hospital's physical address listed is [address]     Please follow these instructions carefully (unless otherwise directed):  Hold all erectile dysfunction medications at least 3 days (72 hrs) prior to test. (Ie viagra, cialis, sildenafil, tadalafil, etc) We will administer nitroglycerin during this exam.   On the Night Before the Test: Be sure to Drink plenty of water. Do not consume any caffeinated/decaffeinated beverages or chocolate 12 hours prior to your test. Do not take any antihistamines 12 hours  prior to your test.  On the Day of the Test: Drink plenty of water until 1 hour prior to the test. Do not eat any food 1 hour prior to test. You may take your regular medications prior to the test.  Take metoprolol (Lopressor) two hours prior to test. HOLD lisinopril-hydrochlorothiazide morning of the test. Hold metformin morning of test (and 48 hrs after test)      After the Test: Drink plenty of water. After receiving IV contrast, you may experience a mild flushed feeling. This is normal. On occasion, you may experience a mild rash up to 24 hours after the test. This is not dangerous. If this occurs, you can take Benadryl 25 mg and increase your fluid intake. If you experience trouble breathing, this can be serious. If it is severe call 911 IMMEDIATELY. If it is mild, please call our office. If you take any of these medications: Glipizide/Metformin, Avandament, Glucavance, please do not take 48 hours after completing test unless otherwise instructed.  We will call to schedule your test 2-4 weeks out understanding that some insurance companies will need an authorization prior to the service being performed.   For non-scheduling related questions, please contact the cardiac imaging nurse navigator should you have any questions/concerns: Rockwell Alexandria, Cardiac Imaging Nurse Navigator Larey Brick, Cardiac Imaging Nurse Navigator [facility] Direct Office Dial: [phone]   For scheduling needs, including cancellations and rescheduling, please call [place], [phone].

## 2022-08-17 LAB — BASIC METABOLIC PANEL
BUN/Creatinine Ratio: 14 (ref 9–20)
BUN: 13 mg/dL (ref 6–24)
CO2: 23 mmol/L (ref 20–29)
Calcium: 9.3 mg/dL (ref 8.7–10.2)
Chloride: 101 mmol/L (ref 96–106)
Creatinine, Ser: 0.95 mg/dL (ref 0.76–1.27)
Glucose: 111 mg/dL — ABNORMAL HIGH (ref 70–99)
Potassium: 4.3 mmol/L (ref 3.5–5.2)
Sodium: 139 mmol/L (ref 134–144)
eGFR: 96 mL/min/{1.73_m2} (ref >59)

## 2022-09-09 ENCOUNTER — Telehealth (HOSPITAL_COMMUNITY): Payer: Self-pay | Admitting: Emergency Medicine

## 2022-09-09 NOTE — Telephone Encounter (Signed)
Reaching out to patient to offer assistance regarding upcoming cardiac imaging study; pt verbalizes understanding of appt date/time, parking situation and where to check in, pre-test NPO status and medications ordered, and verified current allergies; name and call back number provided for further questions should they arise Rockwell Alexandria RN Navigator Cardiac Imaging [facility] [phone] office [phone] cell   Arrival 830 (coming from appt across street at [facility] for echo-night run behind) Denies iv issues 50mg  metop [place]

## 2022-09-10 ENCOUNTER — Ambulatory Visit (HOSPITAL_BASED_OUTPATIENT_CLINIC_OR_DEPARTMENT_OTHER): Payer: 59

## 2022-09-10 ENCOUNTER — Ambulatory Visit (HOSPITAL_COMMUNITY)
Admission: RE | Admit: 2022-09-10 | Discharge: 2022-09-10 | Disposition: A | Discharge status: Home / Self Care | Payer: 59 | Source: Ambulatory Visit | Attending: Cardiovascular Disease | Admitting: Cardiovascular Disease

## 2022-09-10 DIAGNOSIS — R0602 Shortness of breath: Secondary | ICD-10-CM

## 2022-09-10 DIAGNOSIS — K449 Diaphragmatic hernia without obstruction or gangrene: Secondary | ICD-10-CM | POA: Diagnosis not present

## 2022-09-10 DIAGNOSIS — I361 Nonrheumatic tricuspid (valve) insufficiency: Secondary | ICD-10-CM | POA: Insufficient documentation

## 2022-09-10 DIAGNOSIS — R079 Chest pain, unspecified: Secondary | ICD-10-CM | POA: Diagnosis not present

## 2022-09-10 DIAGNOSIS — K760 Fatty (change of) liver, not elsewhere classified: Secondary | ICD-10-CM | POA: Diagnosis not present

## 2022-09-10 DIAGNOSIS — R072 Precordial pain: Secondary | ICD-10-CM

## 2022-09-10 LAB — ECHOCARDIOGRAM COMPLETE
Area-P 1/2: 4.68 cm2
S' Lateral: 2.8 cm

## 2022-09-10 MED ORDER — NITROGLYCERIN 0.4 MG SL SUBL
0.8000 mg | SUBLINGUAL_TABLET | Freq: Once | SUBLINGUAL | Status: AC
  Administered 2022-09-10: 0.8 mg via SUBLINGUAL

## 2022-09-10 MED ORDER — NITROGLYCERIN 0.4 MG SL SUBL
SUBLINGUAL_TABLET | SUBLINGUAL | Status: AC
  Filled 2022-09-10: qty 2

## 2022-09-10 MED ORDER — IOHEXOL 350 MG/ML SOLN
100.0000 mL | Freq: Once | INTRAVENOUS | Status: AC | PRN
  Administered 2022-09-10: 100 mL via INTRAVENOUS
